# Patient Record
Sex: Male | Born: 2000 | Race: White | Hispanic: No | Marital: Single | Smoking: Never smoker
Health system: Southern US, Community
[De-identification: ages and names within clinical notes are randomized; demographics above are authoritative.]

## PROBLEM LIST (undated history)

## (undated) DIAGNOSIS — F419 Anxiety disorder, unspecified: Secondary | ICD-10-CM

## (undated) DIAGNOSIS — F319 Bipolar disorder, unspecified: Secondary | ICD-10-CM

---
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
## 2019-02-21 ENCOUNTER — Encounter (HOSPITAL_COMMUNITY): Payer: Self-pay | Admitting: Emergency Medicine

## 2019-02-21 ENCOUNTER — Other Ambulatory Visit: Payer: Self-pay

## 2019-02-21 ENCOUNTER — Emergency Department (HOSPITAL_COMMUNITY)
Admission: EM | Admit: 2019-02-21 | Discharge: 2019-02-21 | Disposition: A | Discharge status: Home / Self Care | Payer: Medicaid Other | Attending: Emergency Medicine | Admitting: Emergency Medicine

## 2019-02-21 DIAGNOSIS — Z79899 Other long term (current) drug therapy: Secondary | ICD-10-CM | POA: Insufficient documentation

## 2019-02-21 DIAGNOSIS — R55 Syncope and collapse: Secondary | ICD-10-CM | POA: Insufficient documentation

## 2019-02-21 LAB — CBC WITH DIFFERENTIAL/PLATELET
Abs Immature Granulocytes: 0.02 10*3/uL (ref 0.00–0.07)
Basophils Absolute: 0 10*3/uL (ref 0.0–0.1)
Basophils Relative: 0 %
Eosinophils Absolute: 0.1 10*3/uL (ref 0.0–0.5)
Eosinophils Relative: 1 %
HCT: 45.8 % (ref 39.0–52.0)
Hemoglobin: 15.2 g/dL (ref 13.0–17.0)
Immature Granulocytes: 0 %
Lymphocytes Relative: 17 %
Lymphs Abs: 1.5 10*3/uL (ref 0.7–4.0)
MCH: 32.9 pg (ref 26.0–34.0)
MCHC: 33.2 g/dL (ref 30.0–36.0)
MCV: 99.1 fL (ref 80.0–100.0)
Monocytes Absolute: 0.9 10*3/uL (ref 0.1–1.0)
Monocytes Relative: 11 %
Neutro Abs: 6 10*3/uL (ref 1.7–7.7)
Neutrophils Relative %: 71 %
Platelets: 194 10*3/uL (ref 150–400)
RBC: 4.62 MIL/uL (ref 4.22–5.81)
RDW: 12.6 % (ref 11.5–15.5)
WBC: 8.4 10*3/uL (ref 4.0–10.5)
nRBC: 0 % (ref 0.0–0.2)

## 2019-02-21 LAB — BASIC METABOLIC PANEL
Anion gap: 7 (ref 5–15)
BUN: 12 mg/dL (ref 6–20)
CO2: 25 mmol/L (ref 22–32)
Calcium: 9.2 mg/dL (ref 8.9–10.3)
Chloride: 107 mmol/L (ref 98–111)
Creatinine, Ser: 0.89 mg/dL (ref 0.61–1.24)
GFR calc Af Amer: >60 mL/min (ref >60)
GFR calc non Af Amer: >60 mL/min (ref >60)
Glucose, Bld: 107 mg/dL — ABNORMAL HIGH (ref 70–99)
Potassium: 3.5 mmol/L (ref 3.5–5.1)
Sodium: 139 mmol/L (ref 135–145)

## 2019-02-21 MED ORDER — LACTATED RINGERS IV BOLUS
1000.0000 mL | Freq: Once | INTRAVENOUS | Status: AC
  Administered 2019-02-21: 1000 mL via INTRAVENOUS

## 2019-02-21 NOTE — ED Triage Notes (Signed)
Per [facility] pt comes from laying on the ground at a gas station for heat exhaustion and feeling dizzy. Pt currently staying in hotel not far from gas station and been walking around outside in heat for past 3 days and not eating or drinking. Vitals: 112/68, 80HR, 16R, 100% on RA, CBG 110, 97.5 temp. 18g IV and given NS 531ml in route.

## 2019-02-25 NOTE — ED Provider Notes (Signed)
[facility]-EMERGENCY DEPT Provider Note   CSN: [id number] Arrival date & time: 02/21/19  1618     History   Chief Complaint Chief Complaint  Patient presents with  . Heat Exposure  . Near Syncope    HPI Larry George is a 18 y.o. male.     HPI 18 year old male with near syncope.  Patient stated that he was outside in the heat began to feel dizzy.  He went into a gas station and laid down.  He did not actually lose consciousness.  Patient stated that that he has not been eating or drinking regularly.  He is been spending the last several days out in the heat doing what sounds like panhandling.  He currently feels better.  Denies any acute pain.  Denies any ingestion. Past Medical History:  Diagnosis Date  . Anxiety   . Bipolar 1 disorder [facility](HCC)     Patient Active Problem List   Diagnosis Date Noted  . Paranoid behavior (HCC) 09/25/2018  . Acute psychosis (HCC) 09/25/2018  . Bipolar I disorder, current or most recent episode manic, with psychotic features (HCC)     History reviewed. No pertinent surgical history.      Home Medications    Prior to Admission medications   Medication Sig Start Date End Date Taking? Authorizing Provider  benztropine (COGENTIN) 0.5 MG tablet Take 1 tablet (0.5 mg total) by mouth 2 (two) times daily. 09/30/18   Storm FriskWright, Patrick E, MD  risperiDONE (RISPERDAL) 2 MG tablet Take 1 tablet (2 mg total) by mouth 2 (two) times daily. 09/30/18   Storm FriskWright, Patrick E, MD  traZODone (DESYREL) 150 MG tablet Take 1 tablet (150 mg total) by mouth at bedtime as needed for sleep. 09/30/18   Storm FriskWright, Patrick E, MD    Family History No family history on file.  Social History Social History   Tobacco Use  . Smoking status: Never Smoker  . Smokeless tobacco: Never Used  Substance Use Topics  . Alcohol use: Yes    Frequency: Never  . Drug use: No     Allergies   Patient has no known allergies.   Review of Systems Review of Systems   All systems reviewed and negative, other than as noted in HPI.  Physical Exam Updated Vital Signs BP 123/71 (BP Location: Right Arm)   Pulse 88   Temp 98 F (36.7 C) (Oral)   Resp 15   SpO2 98%   Physical Exam Vitals signs and nursing note reviewed.  Constitutional:      General: He is not in acute distress.    Appearance: He is well-developed.  HENT:     Head: Normocephalic and atraumatic.  Eyes:     General:        Right eye: No discharge.        Left eye: No discharge.     Conjunctiva/sclera: Conjunctivae normal.  Neck:     Musculoskeletal: Neck supple.  Cardiovascular:     Rate and Rhythm: Normal rate and regular rhythm.     Heart sounds: Normal heart sounds. No murmur. No friction rub. No gallop.   Pulmonary:     Effort: Pulmonary effort is normal. No respiratory distress.     Breath sounds: Normal breath sounds.  Abdominal:     General: There is no distension.     Palpations: Abdomen is soft.     Tenderness: There is no abdominal tenderness.  Musculoskeletal:        General:  No tenderness.  Skin:    General: Skin is warm and dry.  Neurological:     Mental Status: He is alert.  Psychiatric:        Behavior: Behavior normal.        Thought Content: Thought content normal.      ED Treatments / Results  Labs (all labs ordered are listed, but only abnormal results are displayed) Labs Reviewed  BASIC METABOLIC PANEL - Abnormal; Notable for the following components:      Result Value   Glucose, Bld 107 (*)    All other components within normal limits  CBC WITH DIFFERENTIAL/PLATELET    EKG EKG Interpretation  Date/Time:  Sunday February 21 2019 17:10:16 EDT Ventricular Rate:  69 PR Interval:    QRS Duration: 85 QT Interval:  392 QTC Calculation: 420 R Axis:   70 Text Interpretation:  Sinus rhythm Short PR interval Confirmed by Dakwon Wenberg ([id number]) on 02/21/2019 5:50:38 PM Also confirmed by Brylie Sneath ([id number]), editor [place], Beverly ([id number])  on  02/22/2019 6:50:38 AM   Radiology No results found.  Procedures Procedures (including critical care time)  Medications Ordered in ED Medications  lactated ringers bolus 1,000 mL (0 mLs Intravenous Stopped 02/21/19 1837)     Initial Impression / Assessment and Plan / ED Course  I have reviewed the triage vital signs and the nursing notes.  Pertinent labs & imaging results that were available during my care of the patient were reviewed by me and considered in my medical decision making (see chart for details).        18  year old male with near syncope in the setting of irregular nutrition and being out in the heat for extended period time over the past several days.  He is currently feeling much better.  He looks well on exam.  He is hemodynamically stable.  Exam is nonfocal.  Basic work-up pretty unremarkable.  I doubt emergent condition.  Advised to stay well-hydrated and cool the best he can.  Emergent return precautions were discussed.  Final Clinical Impressions(s) / ED Diagnoses   Final diagnoses:  Near syncope    ED Discharge Orders    None       Virgel Manifold, MD 02/25/19 [phone]

## 2019-05-01 ENCOUNTER — Other Ambulatory Visit: Payer: Self-pay

## 2019-05-01 ENCOUNTER — Emergency Department (HOSPITAL_COMMUNITY)
Admission: EM | Admit: 2019-05-01 | Discharge: 2019-05-01 | Disposition: A | Discharge status: Home / Self Care | Payer: Medicaid Other | Attending: Emergency Medicine | Admitting: Emergency Medicine

## 2019-05-01 ENCOUNTER — Emergency Department (HOSPITAL_COMMUNITY): Payer: Medicaid Other

## 2019-05-01 DIAGNOSIS — Z79899 Other long term (current) drug therapy: Secondary | ICD-10-CM | POA: Insufficient documentation

## 2019-05-01 DIAGNOSIS — E860 Dehydration: Secondary | ICD-10-CM

## 2019-05-01 DIAGNOSIS — Z20828 Contact with and (suspected) exposure to other viral communicable diseases: Secondary | ICD-10-CM | POA: Insufficient documentation

## 2019-05-01 DIAGNOSIS — R55 Syncope and collapse: Secondary | ICD-10-CM

## 2019-05-01 LAB — BASIC METABOLIC PANEL
Anion gap: 10 (ref 5–15)
BUN: 8 mg/dL (ref 6–20)
CO2: 22 mmol/L (ref 22–32)
Calcium: 9.2 mg/dL (ref 8.9–10.3)
Chloride: 104 mmol/L (ref 98–111)
Creatinine, Ser: 0.94 mg/dL (ref 0.61–1.24)
GFR calc Af Amer: >60 mL/min (ref >60)
GFR calc non Af Amer: >60 mL/min (ref >60)
Glucose, Bld: 89 mg/dL (ref 70–99)
Potassium: 4.2 mmol/L (ref 3.5–5.1)
Sodium: 136 mmol/L (ref 135–145)

## 2019-05-01 LAB — CBC WITH DIFFERENTIAL/PLATELET
Abs Immature Granulocytes: 0.05 10*3/uL (ref 0.00–0.07)
Basophils Absolute: 0 10*3/uL (ref 0.0–0.1)
Basophils Relative: 0 %
Eosinophils Absolute: 0 10*3/uL (ref 0.0–0.5)
Eosinophils Relative: 0 %
HCT: 43.6 % (ref 39.0–52.0)
Hemoglobin: 15.1 g/dL (ref 13.0–17.0)
Immature Granulocytes: 1 %
Lymphocytes Relative: 14 %
Lymphs Abs: 1.5 10*3/uL (ref 0.7–4.0)
MCH: 34 pg (ref 26.0–34.0)
MCHC: 34.6 g/dL (ref 30.0–36.0)
MCV: 98.2 fL (ref 80.0–100.0)
Monocytes Absolute: 1.3 10*3/uL — ABNORMAL HIGH (ref 0.1–1.0)
Monocytes Relative: 12 %
Neutro Abs: 7.7 10*3/uL (ref 1.7–7.7)
Neutrophils Relative %: 73 %
Platelets: 215 10*3/uL (ref 150–400)
RBC: 4.44 MIL/uL (ref 4.22–5.81)
RDW: 12.3 % (ref 11.5–15.5)
WBC: 10.6 10*3/uL — ABNORMAL HIGH (ref 4.0–10.5)
nRBC: 0 % (ref 0.0–0.2)

## 2019-05-01 LAB — CBG MONITORING, ED: Glucose-Capillary: 73 mg/dL (ref 70–99)

## 2019-05-01 MED ORDER — SODIUM CHLORIDE 0.9 % IV BOLUS
1000.0000 mL | Freq: Once | INTRAVENOUS | Status: AC
  Administered 2019-05-01: 16:00:00 1000 mL via INTRAVENOUS

## 2019-05-01 NOTE — Discharge Instructions (Signed)
We will contact you with results of your COVID test if it is positive. Make sure you fluid intake, take your home medications as you are prescribed. Follow-up with your primary care provider. Return to the ED if you start to have worsening symptoms, chest pain, shortness of breath, leg swelling.

## 2019-05-01 NOTE — ED Provider Notes (Signed)
[place] EMERGENCY DEPARTMENT Provider Note   CSN: [id number] Arrival date & time: 05/01/19  1456     History   Chief Complaint Chief Complaint  Patient presents with   Loss of Consciousness    HPI Larry George is a 18 y.o. male with a past medical history of anxiety, bipolar disorder presents to ED for syncope that occurred just prior to arrival.  States that he woke up this morning feeling like his chest was tight, lightheaded.  He was at the bus stop when he told the other people there to call 911 so he could be evaluated in the hospital.  States a known listen to him, he went to a restaurant and tried to tell those people to call 911 when he syncopized there.  States that he has syncopized in the past several times ever since he was younger.  He was never able to find a cause for his syncope.  Patient is homeless and is unsure if he was around sick contacts with similar symptoms.  He reports intermittent dry cough.  Unsure if he has had any fevers.  He was told in the past that he had asthma as a child.  He reports decreased appetite over the past several days, has been drinking more energy drinks because of recommendations by an acquaintance.  He denies any alcohol, drug use or tobacco use, headache, vision changes, vomiting, diarrhea, abdominal pain, numbness in arms or legs.tr     HPI  Past Medical History:  Diagnosis Date   Anxiety    Bipolar 1 disorder [facility])     Patient Active Problem List   Diagnosis Date Noted   Paranoid behavior ([place]) 09/25/2018   Acute psychosis ([place]) 09/25/2018   Bipolar I disorder, current or most recent episode manic, with psychotic features ([place])     No past surgical history on file.      Home Medications    Prior to Admission medications   Medication Sig Start Date End Date Taking? Authorizing Provider  benztropine (COGENTIN) 0.5 MG tablet Take 1 tablet (0.5 mg total) by mouth 2 (two) times daily. 09/30/18   Elsie Stain, MD  risperiDONE (RISPERDAL) 2 MG tablet Take 1 tablet (2 mg total) by mouth 2 (two) times daily. 09/30/18   Elsie Stain, MD  traZODone (DESYREL) 150 MG tablet Take 1 tablet (150 mg total) by mouth at bedtime as needed for sleep. 09/30/18   Elsie Stain, MD    Family History No family history on file.  Social History Social History   Tobacco Use   Smoking status: Never Smoker   Smokeless tobacco: Never Used  Substance Use Topics   Alcohol use: Yes    Frequency: Never   Drug use: No     Allergies   Patient has no known allergies.   Review of Systems Review of Systems  Constitutional: Negative for appetite change, chills and fever.  HENT: Negative for ear pain, rhinorrhea, sneezing and sore throat.   Eyes: Negative for photophobia and visual disturbance.  Respiratory: Positive for cough and chest tightness. Negative for shortness of breath and wheezing.   Cardiovascular: Negative for chest pain and palpitations.  Gastrointestinal: Negative for abdominal pain, blood in stool, constipation, diarrhea, nausea and vomiting.  Genitourinary: Negative for dysuria, hematuria and urgency.  Musculoskeletal: Negative for myalgias.  Skin: Negative for rash.  Neurological: Positive for syncope. Negative for dizziness, weakness and light-headedness.     Physical Exam Updated Vital  Signs BP (!) 104/52    Pulse (!) 59    Temp 98.6 F (37 C) (Oral)    Resp 17    SpO2 96%   Physical Exam Vitals signs and nursing note reviewed.  Constitutional:      General: He is not in acute distress.    Appearance: He is well-developed.  HENT:     Head: Normocephalic and atraumatic.     Nose: Nose normal.  Eyes:     General: No scleral icterus.       Right eye: No discharge.        Left eye: No discharge.     Conjunctiva/sclera: Conjunctivae normal.     Pupils: Pupils are equal, round, and reactive to light.  Neck:     Musculoskeletal: Normal range of motion and neck  supple.  Cardiovascular:     Rate and Rhythm: Normal rate and regular rhythm.     Heart sounds: Normal heart sounds. No murmur. No friction rub. No gallop.   Pulmonary:     Effort: Pulmonary effort is normal. No respiratory distress.     Breath sounds: Normal breath sounds.  Abdominal:     General: Bowel sounds are normal. There is no distension.     Palpations: Abdomen is soft.     Tenderness: There is no abdominal tenderness. There is no guarding.  Musculoskeletal: Normal range of motion.  Skin:    General: Skin is warm and dry.     Findings: No rash.  Neurological:     General: No focal deficit present.     Mental Status: He is alert and oriented to person, place, and time.     Cranial Nerves: No cranial nerve deficit.     Sensory: No sensory deficit.     Motor: No weakness or abnormal muscle tone.     Coordination: Coordination normal.     Comments: Pupils reactive. No facial asymmetry noted. Cranial nerves appear grossly intact. Sensation intact to light touch on face, BUE and BLE. Strength 5/5 in BUE and BLE.  Alert and oriented x4.      ED Treatments / Results  Labs (all labs ordered are listed, but only abnormal results are displayed) Labs Reviewed  CBC WITH DIFFERENTIAL/PLATELET - Abnormal; Notable for the following components:      Result Value   WBC 10.6 (*)    Monocytes Absolute 1.3 (*)    All other components within normal limits  NOVEL CORONAVIRUS, NAA (HOSP ORDER, SEND-OUT TO REF LAB; TAT 18-24 HRS)  BASIC METABOLIC PANEL  CBG MONITORING, ED    EKG EKG Interpretation  Date/Time:  Saturday May 01 2019 15:42:55 EDT Ventricular Rate:  80 PR Interval:    QRS Duration: 80 QT Interval:  389 QTC Calculation: 449 R Axis:   86 Text Interpretation:  Sinus rhythm RSR' in V1 or V2, probably normal variant ST elev, probable normal early repol pattern No STEMI  Confirmed by Alvester Chourifan, Matthew [id number]) on 05/01/2019 3:50:44 PM   Radiology Dg Chest 2  View  Result Date: 05/01/2019 CLINICAL DATA:  Syncopal episode. Myalgias and chest pain with inspiration. EXAM: CHEST - 2 VIEW COMPARISON:  09/26/2018 FINDINGS: The heart size and mediastinal contours are within normal limits. Both lungs are clear. The visualized skeletal structures are unremarkable. IMPRESSION: Normal examination. Electronically Signed   By: Beckie SaltsSteven  Reid M.D.   On: 05/01/2019 16:56    Procedures Procedures (including critical care time)  Medications Ordered in ED Medications  sodium chloride  0.9 % bolus 1,000 mL (1,000 mLs Intravenous New Bag/Given 05/01/19 1551)     Initial Impression / Assessment and Plan / ED Course  I have reviewed the triage vital signs and the nursing notes.  Pertinent labs & imaging results that were available during my care of the patient were reviewed by me and considered in my medical decision making (see chart for details).  Clinical Course as of May 01 1851  Sat May 01, 2019  1850 Orthostatic Lying  BP- Lying:105/72 (pt states having slight dizziness in all three positions) Pulse- Lying:65 Orthostatic Sitting BP- Sitting:139/88 Pulse- Sitting:63 Orthostatic Standing at 0 minutes BP- Standing at 0 minutes:118/82 Pulse- Standing at 0 minutes:88   [HK]    Clinical Course User Index [HK] Dietrich Pates, PA-C       18 year old male with past medical history of bipolar disorder, presents to ED for syncopal episode that occurred prior to arrival.  States that he woke up this morning feeling like his chest was tight and was coughing.  He does note having frequent energy drinks over the past few days ago but did not eat that much yesterday.  He has been having syncopal episodes since he was younger without specific cause.  On exam patient is overall well-appearing.  He is not tachycardic, tachypneic or hypoxic.  No deficits neurological exam noted.  No tenderness palpation of the cervical spine.  He is ambulatory with normal gait.   Orthostatic vital signs as noted above.  EKG with normal sinus rhythm.  CBC, BMP and CBC unremarkable.  Chest x-ray is unremarkable.  Patient was given IV fluids with significant improvement in his symptoms.  He is PERC negative.  He is low risk by heart score so I do not feel that his symptoms are due to ACS.  He is homeless, unsure if he has had any sick contacts with COVID-19 so we will send an outpatient COVID test.  We will have him follow-up with PCP and return for worsening symptoms.  Patient is hemodynamically stable, in NAD, and able to ambulate in the ED. Evaluation does not show pathology that would require ongoing emergent intervention or inpatient treatment. I explained the diagnosis to the patient. Pain has been managed and has no complaints prior to discharge. Patient is comfortable with above plan and is stable for discharge at this time. All questions were answered prior to disposition. Strict return precautions for returning to the ED were discussed. Encouraged follow up with PCP.   An After Visit Summary was printed and given to the patient.   Portions of this note were generated with Scientist, clinical (histocompatibility and immunogenetics). Dictation errors may occur despite best attempts at proofreading.   Final Clinical Impressions(s) / ED Diagnoses   Final diagnoses:  Syncope and collapse  Dehydration    ED Discharge Orders    None       Dietrich Pates, PA-C 05/01/19 1852    Terald Sleeper, MD 05/01/19 2351

## 2019-05-01 NOTE — ED Triage Notes (Signed)
[facility] reports that the patient was at the bus stop when he became light-headed and weak. He stumbled into a [place] and had a syncopal episode. He has been complaining of body aches and chest pain when he breathes in. He denies N/V/D and cough. EMS gave 324 mg of aspirin.  Last vitals: 102/56, rr-28, hr-84, 100% RA, CBG-102 and 100.2 F.

## 2019-05-03 LAB — NOVEL CORONAVIRUS, NAA (HOSP ORDER, SEND-OUT TO REF LAB; TAT 18-24 HRS): SARS-CoV-2, NAA: NOT DETECTED

## 2019-05-04 ENCOUNTER — Other Ambulatory Visit: Payer: Self-pay

## 2019-05-04 ENCOUNTER — Emergency Department (HOSPITAL_COMMUNITY)
Admission: EM | Admit: 2019-05-04 | Discharge: 2019-05-04 | Disposition: A | Discharge status: Home / Self Care | Payer: Medicaid Other | Attending: Emergency Medicine | Admitting: Emergency Medicine

## 2019-05-04 ENCOUNTER — Encounter (HOSPITAL_COMMUNITY): Payer: Self-pay | Admitting: Emergency Medicine

## 2019-05-04 DIAGNOSIS — Z79899 Other long term (current) drug therapy: Secondary | ICD-10-CM | POA: Insufficient documentation

## 2019-05-04 DIAGNOSIS — R55 Syncope and collapse: Secondary | ICD-10-CM | POA: Diagnosis not present

## 2019-05-04 LAB — CBG MONITORING, ED: Glucose-Capillary: 131 mg/dL — ABNORMAL HIGH (ref 70–99)

## 2019-05-04 NOTE — ED Triage Notes (Signed)
Pt presents to ED BIB [facility]. Pt found unresponsive on side of road. Per witness pt was seen doing "jerking-like" movements. Pt no memory of this. Pt says he just "blacks out a lot". EMS VSS.

## 2019-05-04 NOTE — Discharge Instructions (Signed)
Follow up with your family do

## 2019-05-04 NOTE — ED Provider Notes (Signed)
[facility] EMERGENCY DEPARTMENT Provider Note   CSN: [id number] Arrival date & time: 05/04/19  2219     History   Chief Complaint Chief Complaint  Patient presents with  . Loss of Consciousness    HPI Larry George is a 18 y.o. male.     18 yo M with a chief complaints of a syncopal event.  Patient is somewhat vague on the exact events.  There was some described shaking by a bystander.  Patient states that he has not been taking his medications for over a year.  Denies starting them back after being hospitalized in March.  Denies any other recent illnesses.  He states that these episodes happen to him a lot where he spontaneously collapses.  No one seems to know what they are.  The history is provided by the patient.  Loss of Consciousness Associated symptoms: no chest pain, no confusion, no fever, no headaches, no palpitations, no shortness of breath and no vomiting   Illness Severity:  Moderate Onset quality:  Gradual Duration:  2 minutes Timing:  Intermittent Progression:  Resolved Chronicity:  Recurrent Associated symptoms: no abdominal pain, no chest pain, no congestion, no diarrhea, no fever, no headaches, no myalgias, no rash, no shortness of breath and no vomiting     Past Medical History:  Diagnosis Date  . Anxiety   . Bipolar 1 disorder [facility])     Patient Active Problem List   Diagnosis Date Noted  . Paranoid behavior (HCC) 09/25/2018  . Acute psychosis (HCC) 09/25/2018  . Bipolar I disorder, current or most recent episode manic, with psychotic features (HCC)     History reviewed. No pertinent surgical history.      Home Medications    Prior to Admission medications   Medication Sig Start Date End Date Taking? Authorizing Provider  amphetamine-dextroamphetamine (ADDERALL XR) 10 MG 24 hr capsule Take 10 mg by mouth daily. 08/04/15  Yes [provider]  benztropine (COGENTIN) 0.5 MG tablet Take 1 tablet (0.5 mg total) by mouth  2 (two) times daily. 09/30/18  Yes Storm Frisk, MD  risperiDONE (RISPERDAL) 2 MG tablet Take 1 tablet (2 mg total) by mouth 2 (two) times daily. 09/30/18  Yes Storm Frisk, MD  traZODone (DESYREL) 150 MG tablet Take 1 tablet (150 mg total) by mouth at bedtime as needed for sleep. 09/30/18  Yes Storm Frisk, MD    Family History History reviewed. No pertinent family history.  Social History Social History   Tobacco Use  . Smoking status: Never Smoker  . Smokeless tobacco: Never Used  Substance Use Topics  . Alcohol use: Yes    Frequency: Never  . Drug use: No     Allergies   Patient has no known allergies.   Review of Systems Review of Systems  Constitutional: Negative for chills and fever.  HENT: Negative for congestion and facial swelling.   Eyes: Negative for discharge and visual disturbance.  Respiratory: Negative for shortness of breath.   Cardiovascular: Positive for syncope. Negative for chest pain and palpitations.  Gastrointestinal: Negative for abdominal pain, diarrhea and vomiting.  Musculoskeletal: Negative for arthralgias and myalgias.  Skin: Negative for color change and rash.  Neurological: Positive for syncope. Negative for tremors and headaches.  Psychiatric/Behavioral: Negative for confusion and dysphoric mood.     Physical Exam Updated Vital Signs Pulse (!) 59   Resp 17   Ht 5\' 9"  (1.753 m)   Wt 54 kg  SpO2 98%   BMI 17.57 kg/m   Physical Exam Vitals signs and nursing note reviewed.  Constitutional:      Appearance: He is well-developed.  HENT:     Head: Normocephalic and atraumatic.  Eyes:     Pupils: Pupils are equal, round, and reactive to light.  Neck:     Musculoskeletal: Normal range of motion and neck supple.     Vascular: No JVD.  Cardiovascular:     Rate and Rhythm: Normal rate and regular rhythm.     Heart sounds: No murmur. No friction rub. No gallop.   Pulmonary:     Effort: No respiratory distress.     Breath  sounds: No wheezing.  Abdominal:     General: There is no distension.     Tenderness: There is no abdominal tenderness. There is no guarding or rebound.  Musculoskeletal: Normal range of motion.  Skin:    Coloration: Skin is not pale.     Findings: No rash.  Neurological:     Mental Status: He is alert and oriented to person, place, and time.  Psychiatric:        Behavior: Behavior normal.      ED Treatments / Results  Labs (all labs ordered are listed, but only abnormal results are displayed) Labs Reviewed  CBG MONITORING, ED - Abnormal; Notable for the following components:      Result Value   Glucose-Capillary 131 (*)    All other components within normal limits    EKG EKG Interpretation  Date/Time:  Tuesday May 04 2019 22:30:00 EDT Ventricular Rate:  66 PR Interval:    QRS Duration: 79 QT Interval:  404 QTC Calculation: 424 R Axis:   65 Text Interpretation:  Sinus rhythm RSR' in V1 or V2, probably normal variant no wpw, prolonged qt or brugada No significant change since last tracing Confirmed by Deno Etienne [phone]) on 05/04/2019 10:32:32 PM   Radiology No results found.  Procedures Procedures (including critical care time)  Medications Ordered in ED Medications - No data to display   Initial Impression / Assessment and Plan / ED Course  I have reviewed the triage vital signs and the nursing notes.  Pertinent labs & imaging results that were available during my care of the patient were reviewed by me and considered in my medical decision making (see chart for details).        18 yo M with a chief complaint of a syncopal event.  Patient is well-appearing and nontoxic.  He has had multiple of these in the past.  Unsure of the exact etiology though he has had work-up in the ED as recent as 2 days ago.  Had lab work performed.  Young person not on medications I do not feel that I need to repeat labs today.  EKG is essentially unchanged.  Will discharge  home.  11:20 PM:  I have discussed the diagnosis/risks/treatment options with the patient and believe the pt to be eligible for discharge home to follow-up with PCP. We also discussed returning to the ED immediately if new or worsening sx occur. We discussed the sx which are most concerning (e.g., sudden worsening pain, fever, inability to tolerate by mouth) that necessitate immediate return. Medications administered to the patient during their visit and any new prescriptions provided to the patient are listed below.  Medications given during this visit Medications - No data to display   The patient appears reasonably screen and/or stabilized for discharge and I  doubt any other medical condition or other [facility] requiring further screening, evaluation, or treatment in the ED at this time prior to discharge.    Final Clinical Impressions(s) / ED Diagnoses   Final diagnoses:  Syncope and collapse    ED Discharge Orders    None       Melene PlanFloyd, Mckennon Zwart, DO 05/04/19 2320

## 2019-05-04 NOTE — ED Notes (Signed)
Discharge instructions discussed with pt. Pt verbalized understanding. Pt stable and ambulatory. No signature pad available. 

## 2019-05-05 ENCOUNTER — Encounter (HOSPITAL_COMMUNITY): Payer: Self-pay | Admitting: *Deleted

## 2019-05-05 ENCOUNTER — Emergency Department (HOSPITAL_COMMUNITY)
Admission: EM | Admit: 2019-05-05 | Discharge: 2019-05-05 | Disposition: A | Discharge status: Home / Self Care | Payer: Medicaid Other | Attending: Emergency Medicine | Admitting: Emergency Medicine

## 2019-05-05 DIAGNOSIS — R55 Syncope and collapse: Secondary | ICD-10-CM | POA: Diagnosis not present

## 2019-05-05 LAB — BASIC METABOLIC PANEL
Anion gap: 11 (ref 5–15)
BUN: 9 mg/dL (ref 6–20)
CO2: 26 mmol/L (ref 22–32)
Calcium: 9.4 mg/dL (ref 8.9–10.3)
Chloride: 101 mmol/L (ref 98–111)
Creatinine, Ser: 0.84 mg/dL (ref 0.61–1.24)
GFR calc Af Amer: >60 mL/min (ref >60)
GFR calc non Af Amer: >60 mL/min (ref >60)
Glucose, Bld: 102 mg/dL — ABNORMAL HIGH (ref 70–99)
Potassium: 3.6 mmol/L (ref 3.5–5.1)
Sodium: 138 mmol/L (ref 135–145)

## 2019-05-05 LAB — URINALYSIS, ROUTINE W REFLEX MICROSCOPIC
Bilirubin Urine: NEGATIVE
Glucose, UA: NEGATIVE mg/dL
Hgb urine dipstick: NEGATIVE
Ketones, ur: NEGATIVE mg/dL
Leukocytes,Ua: NEGATIVE
Nitrite: NEGATIVE
Protein, ur: NEGATIVE mg/dL
Specific Gravity, Urine: 1.008 (ref 1.005–1.030)
pH: 7 (ref 5.0–8.0)

## 2019-05-05 LAB — CBC
HCT: 46.3 % (ref 39.0–52.0)
Hemoglobin: 15 g/dL (ref 13.0–17.0)
MCH: 32.9 pg (ref 26.0–34.0)
MCHC: 32.4 g/dL (ref 30.0–36.0)
MCV: 101.5 fL — ABNORMAL HIGH (ref 80.0–100.0)
Platelets: 241 10*3/uL (ref 150–400)
RBC: 4.56 MIL/uL (ref 4.22–5.81)
RDW: 12.5 % (ref 11.5–15.5)
WBC: 9.1 10*3/uL (ref 4.0–10.5)
nRBC: 0 % (ref 0.0–0.2)

## 2019-05-05 LAB — CBG MONITORING, ED: Glucose-Capillary: 89 mg/dL (ref 70–99)

## 2019-05-05 MED ORDER — SODIUM CHLORIDE 0.9% FLUSH: 3.0000 mL | Freq: Once | INTRAVENOUS | Status: DC

## 2019-05-05 NOTE — ED Triage Notes (Signed)
Pt bib EMS and presents after a witnessed LOC.  Pt was walking through the parking lot and sat down and was out for brief period of time. On EMS arrival, pt was alert.  In triage, pt is a/o x 4 and denies any pain. Pt reports some dizziness.  EMS 12 lead was unremarkable. Pt was evaluated for similar yesterday at [facility] and was dx with dehydration.  Pt is homeless and has issues obtaining medications.  EMS VS: BP: 100/60, HR: 60-80, RR 40-20, O2: 100% Temp 99.7

## 2019-05-05 NOTE — ED Provider Notes (Signed)
[facility]-EMERGENCY DEPT Provider Note   CSN: [id number] Arrival date & time: 05/05/19  1517     History   Chief Complaint Chief Complaint  Patient presents with  . Loss of Consciousness    HPI Larry George is a 18 y.o. male.     18 yo M with a cc of syncope.  Hx of same.  4th visit in a week.    Gets headaches and collapses.  Discussed with mom on the phone, mom and dad have hx of the same.  No other new issues for him, no fevers.    The history is provided by the patient.  Loss of Consciousness Episode history:  Single Most recent episode:  Today Duration:  2 minutes Timing:  Constant Progression:  Resolved Chronicity:  New Witnessed: no   Relieved by:  Nothing Worsened by:  Nothing Ineffective treatments:  None tried Associated symptoms: no chest pain, no confusion, no fever, no headaches, no palpitations, no shortness of breath and no vomiting     Past Medical History:  Diagnosis Date  . Anxiety   . Bipolar 1 disorder [facility](HCC)     Patient Active Problem List   Diagnosis Date Noted  . Paranoid behavior (HCC) 09/25/2018  . Acute psychosis (HCC) 09/25/2018  . Bipolar I disorder, current or most recent episode manic, with psychotic features (HCC)     History reviewed. No pertinent surgical history.      Home Medications    Prior to Admission medications   Medication Sig Start Date End Date Taking? Authorizing Provider  benztropine (COGENTIN) 0.5 MG tablet Take 1 tablet (0.5 mg total) by mouth 2 (two) times daily. Patient not taking: Reported on 05/05/2019 09/30/18   Storm FriskWright, Patrick E, MD  risperiDONE (RISPERDAL) 2 MG tablet Take 1 tablet (2 mg total) by mouth 2 (two) times daily. Patient not taking: Reported on 05/05/2019 09/30/18   Storm FriskWright, Patrick E, MD  traZODone (DESYREL) 150 MG tablet Take 1 tablet (150 mg total) by mouth at bedtime as needed for sleep. Patient not taking: Reported on 05/05/2019 09/30/18   Storm FriskWright, Patrick E, MD     Family History No family history on file.  Social History Social History   Tobacco Use  . Smoking status: Never Smoker  . Smokeless tobacco: Never Used  Substance Use Topics  . Alcohol use: Yes    Frequency: Never  . Drug use: No     Allergies   Patient has no known allergies.   Review of Systems Review of Systems  Constitutional: Negative for chills and fever.  HENT: Negative for congestion and facial swelling.   Eyes: Negative for discharge and visual disturbance.  Respiratory: Negative for shortness of breath.   Cardiovascular: Positive for syncope. Negative for chest pain and palpitations.  Gastrointestinal: Negative for abdominal pain, diarrhea and vomiting.  Musculoskeletal: Negative for arthralgias and myalgias.  Skin: Negative for color change and rash.  Neurological: Negative for tremors, syncope and headaches.  Psychiatric/Behavioral: Negative for confusion and dysphoric mood.     Physical Exam Updated Vital Signs BP 125/77 (BP Location: Right Arm)   Pulse 63   Temp 98.3 F (36.8 C) (Oral)   Resp 13   Ht 5\' 9"  (1.753 m)   Wt 54.4 kg   SpO2 97%   BMI 17.72 kg/m   Physical Exam Vitals signs and nursing note reviewed.  Constitutional:      Appearance: He is well-developed.  HENT:     Head: Normocephalic  and atraumatic.  Eyes:     Pupils: Pupils are equal, round, and reactive to light.  Neck:     Musculoskeletal: Normal range of motion and neck supple.     Vascular: No JVD.  Cardiovascular:     Rate and Rhythm: Normal rate and regular rhythm.     Heart sounds: No murmur. No friction rub. No gallop.   Pulmonary:     Effort: No respiratory distress.     Breath sounds: No wheezing.  Abdominal:     General: There is no distension.     Tenderness: There is no guarding or rebound.  Musculoskeletal: Normal range of motion.  Skin:    Coloration: Skin is not pale.     Findings: No rash.  Neurological:     Mental Status: He is alert and oriented  to person, place, and time.  Psychiatric:        Behavior: Behavior normal.      ED Treatments / Results  Labs (all labs ordered are listed, but only abnormal results are displayed) Labs Reviewed  BASIC METABOLIC PANEL - Abnormal; Notable for the following components:      Result Value   Glucose, Bld 102 (*)    All other components within normal limits  CBC - Abnormal; Notable for the following components:   MCV 101.5 (*)    All other components within normal limits  URINALYSIS, ROUTINE W REFLEX MICROSCOPIC - Abnormal; Notable for the following components:   Color, Urine STRAW (*)    All other components within normal limits  CBG MONITORING, ED    EKG EKG Interpretation  Date/Time:  Wednesday May 05 2019 15:34:10 EDT Ventricular Rate:  75 PR Interval:    QRS Duration: 79 QT Interval:  382 QTC Calculation: 427 R Axis:   65 Text Interpretation:  Sinus rhythm Borderline short PR interval ST elev, probable normal early repol pattern since last tracing no significant change Confirmed by Daleen Bo [phone]) on 05/05/2019 6:28:48 PM   Radiology No results found.  Procedures Procedures (including critical care time)  Medications Ordered in ED Medications  sodium chloride flush (NS) 0.9 % injection 3 mL (3 mLs Intravenous Not Given 05/05/19 2052)     Initial Impression / Assessment and Plan / ED Course  I have reviewed the triage vital signs and the nursing notes.  Pertinent labs & imaging results that were available during my care of the patient were reviewed by me and considered in my medical decision making (see chart for details).        18 yo M with a cc of syncope.  Multiple visits here to the ED recently.  Talked with mom on the phone who felt that patient should follow up with his PCP in [place].  Patient wants to stay here, will get case management to try and set up appointment with pcp/neuro.    11:07 PM:  I have discussed the diagnosis/risks/treatment  options with the patient and believe the pt to be eligible for discharge home to follow-up with PCP, neuro. We also discussed returning to the ED immediately if new or worsening sx occur. We discussed the sx which are most concerning (e.g., sudden worsening pain, fever, inability to tolerate by mouth) that necessitate immediate return. Medications administered to the patient during their visit and any new prescriptions provided to the patient are listed below.  Medications given during this visit Medications  sodium chloride flush (NS) 0.9 % injection 3 mL (3 mLs Intravenous Not  Given 05/05/19 2052)     The patient appears reasonably screen and/or stabilized for discharge and I doubt any other medical condition or other [facility] requiring further screening, evaluation, or treatment in the ED at this time prior to discharge.    Final Clinical Impressions(s) / ED Diagnoses   Final diagnoses:  Syncope and collapse    ED Discharge Orders         Ordered    Ambulatory referral to Neurology    Comments: Seizures?   05/05/19 2051           Melene Plan, DO 05/05/19 2307

## 2019-05-07 ENCOUNTER — Telehealth: Payer: Self-pay | Admitting: *Deleted

## 2019-05-07 NOTE — Telephone Encounter (Addendum)
TOC CM attempted to call pt and no number listed for patient. Contacted mother, emergency contact. Provided her with my information and she will send to pt. Pt will need a follow up with PCP. Per stepmother, pt is homeless. He does not have phone. They have worked with him trying to get assistance but pt chooses to live on the street. Pt had SSI in the past, but currently does not have any finances. Medicaid is listed as insurance. [place], [place] ED TOC CM [phone]

## 2019-05-20 ENCOUNTER — Encounter (HOSPITAL_COMMUNITY): Payer: Self-pay | Admitting: Emergency Medicine

## 2019-05-20 ENCOUNTER — Emergency Department (HOSPITAL_COMMUNITY)
Admission: EM | Admit: 2019-05-20 | Discharge: 2019-05-20 | Disposition: A | Discharge status: Home / Self Care | Payer: Medicaid Other | Attending: Emergency Medicine | Admitting: Emergency Medicine

## 2019-05-20 ENCOUNTER — Other Ambulatory Visit: Payer: Self-pay

## 2019-05-20 DIAGNOSIS — Z5321 Procedure and treatment not carried out due to patient leaving prior to being seen by health care provider: Secondary | ICD-10-CM | POA: Diagnosis not present

## 2019-05-20 DIAGNOSIS — R55 Syncope and collapse: Secondary | ICD-10-CM | POA: Diagnosis not present

## 2019-05-20 LAB — CBC
HCT: 44.8 % (ref 39.0–52.0)
Hemoglobin: 14.3 g/dL (ref 13.0–17.0)
MCH: 32.9 pg (ref 26.0–34.0)
MCHC: 31.9 g/dL (ref 30.0–36.0)
MCV: 103 fL — ABNORMAL HIGH (ref 80.0–100.0)
Platelets: 198 10*3/uL (ref 150–400)
RBC: 4.35 MIL/uL (ref 4.22–5.81)
RDW: 12.9 % (ref 11.5–15.5)
WBC: 8.6 10*3/uL (ref 4.0–10.5)
nRBC: 0 % (ref 0.0–0.2)

## 2019-05-20 LAB — BASIC METABOLIC PANEL
Anion gap: 6 (ref 5–15)
BUN: 12 mg/dL (ref 6–20)
CO2: 25 mmol/L (ref 22–32)
Calcium: 9 mg/dL (ref 8.9–10.3)
Chloride: 109 mmol/L (ref 98–111)
Creatinine, Ser: 0.84 mg/dL (ref 0.61–1.24)
GFR calc Af Amer: >60 mL/min (ref >60)
GFR calc non Af Amer: >60 mL/min (ref >60)
Glucose, Bld: 97 mg/dL (ref 70–99)
Potassium: 4.1 mmol/L (ref 3.5–5.1)
Sodium: 140 mmol/L (ref 135–145)

## 2019-05-20 NOTE — ED Notes (Signed)
Patient has a extra tube of blood in the main lab.  One gold top

## 2019-05-20 NOTE — ED Triage Notes (Signed)
Per [facility] [place] where [place] episode. Pt been seen here couple times for the same. Pt became agitated when EMS was asking him questions.  Vitals: 108/70, R14, 97% on RA, temp 98.4. CBG 136, NSR on EKG.

## 2019-05-28 ENCOUNTER — Other Ambulatory Visit: Payer: Self-pay

## 2019-05-28 DIAGNOSIS — Z20822 Contact with and (suspected) exposure to covid-19: Secondary | ICD-10-CM

## 2019-05-30 LAB — NOVEL CORONAVIRUS, NAA: SARS-CoV-2, NAA: NOT DETECTED

## 2019-06-19 ENCOUNTER — Encounter (HOSPITAL_COMMUNITY): Payer: Self-pay

## 2019-06-19 ENCOUNTER — Emergency Department (HOSPITAL_COMMUNITY)
Admission: EM | Admit: 2019-06-19 | Discharge: 2019-06-19 | Disposition: A | Discharge status: Home / Self Care | Payer: Medicaid Other | Attending: Emergency Medicine | Admitting: Emergency Medicine

## 2019-06-19 DIAGNOSIS — Z5321 Procedure and treatment not carried out due to patient leaving prior to being seen by health care provider: Secondary | ICD-10-CM | POA: Insufficient documentation

## 2019-06-19 DIAGNOSIS — R55 Syncope and collapse: Secondary | ICD-10-CM | POA: Diagnosis present

## 2019-06-19 LAB — BASIC METABOLIC PANEL
Anion gap: 8 (ref 5–15)
BUN: 9 mg/dL (ref 6–20)
CO2: 25 mmol/L (ref 22–32)
Calcium: 9.6 mg/dL (ref 8.9–10.3)
Chloride: 105 mmol/L (ref 98–111)
Creatinine, Ser: 0.83 mg/dL (ref 0.61–1.24)
GFR calc Af Amer: >60 mL/min (ref >60)
GFR calc non Af Amer: >60 mL/min (ref >60)
Glucose, Bld: 99 mg/dL (ref 70–99)
Potassium: 4.3 mmol/L (ref 3.5–5.1)
Sodium: 138 mmol/L (ref 135–145)

## 2019-06-19 LAB — CBC
HCT: 49 % (ref 39.0–52.0)
Hemoglobin: 16.1 g/dL (ref 13.0–17.0)
MCH: 32.8 pg (ref 26.0–34.0)
MCHC: 32.9 g/dL (ref 30.0–36.0)
MCV: 99.8 fL (ref 80.0–100.0)
Platelets: 250 10*3/uL (ref 150–400)
RBC: 4.91 MIL/uL (ref 4.22–5.81)
RDW: 12.6 % (ref 11.5–15.5)
WBC: 9.4 10*3/uL (ref 4.0–10.5)
nRBC: 0 % (ref 0.0–0.2)

## 2019-06-19 MED ORDER — SODIUM CHLORIDE 0.9% FLUSH: 3.0000 mL | Freq: Once | INTRAVENOUS | Status: DC

## 2019-06-19 NOTE — ED Triage Notes (Signed)
Pt presents with c/o syncopal episode x 2 reported by patient. Pt reports he closed his eyes for what he thought was a second and then woke up on the ground. Pt c/o dizziness. Also c/o back pain x 4 days from a previous fall.

## 2020-02-20 IMAGING — CR DG SHOULDER 2+V*R*
3 series · 3 of 3 positions shown · non-contrast
Comparison: None.

CLINICAL DATA: Right shoulder pain after fall on the tenth.

EXAM:
RIGHT SHOULDER - 2+ VIEW

[w shoulder external right]
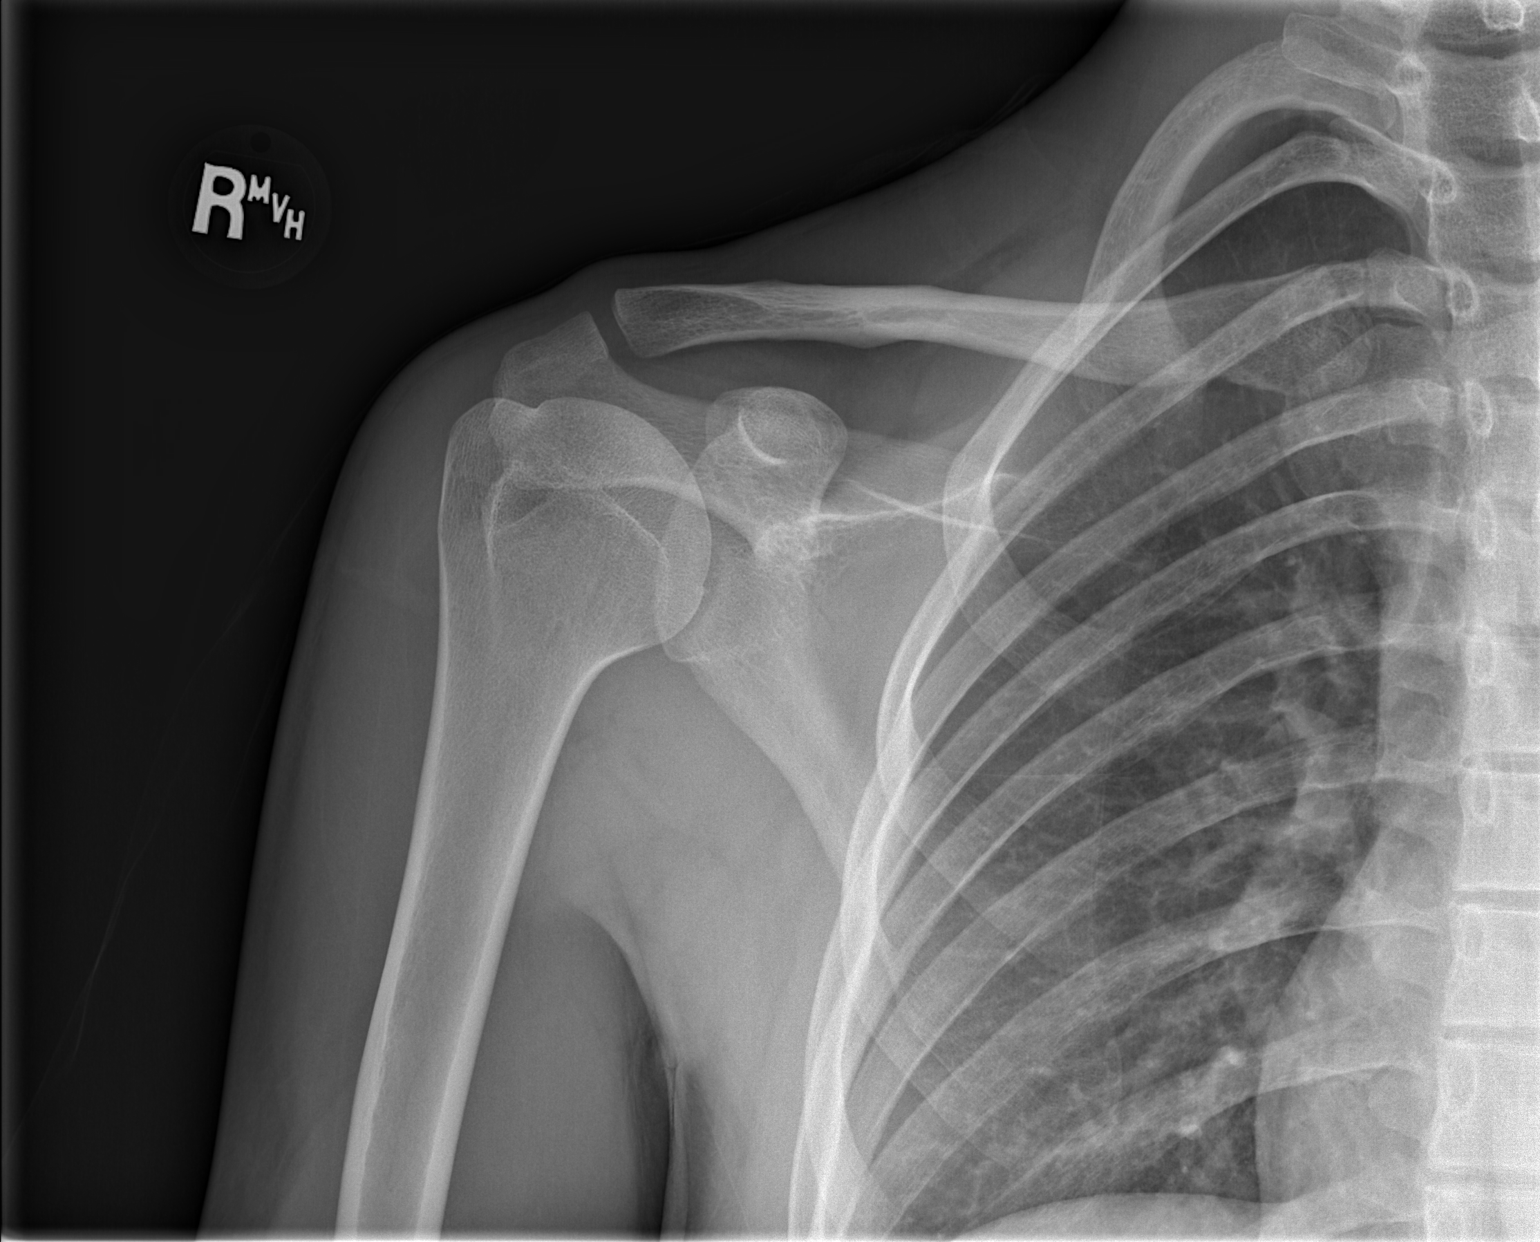

[w shoulder y-view right]
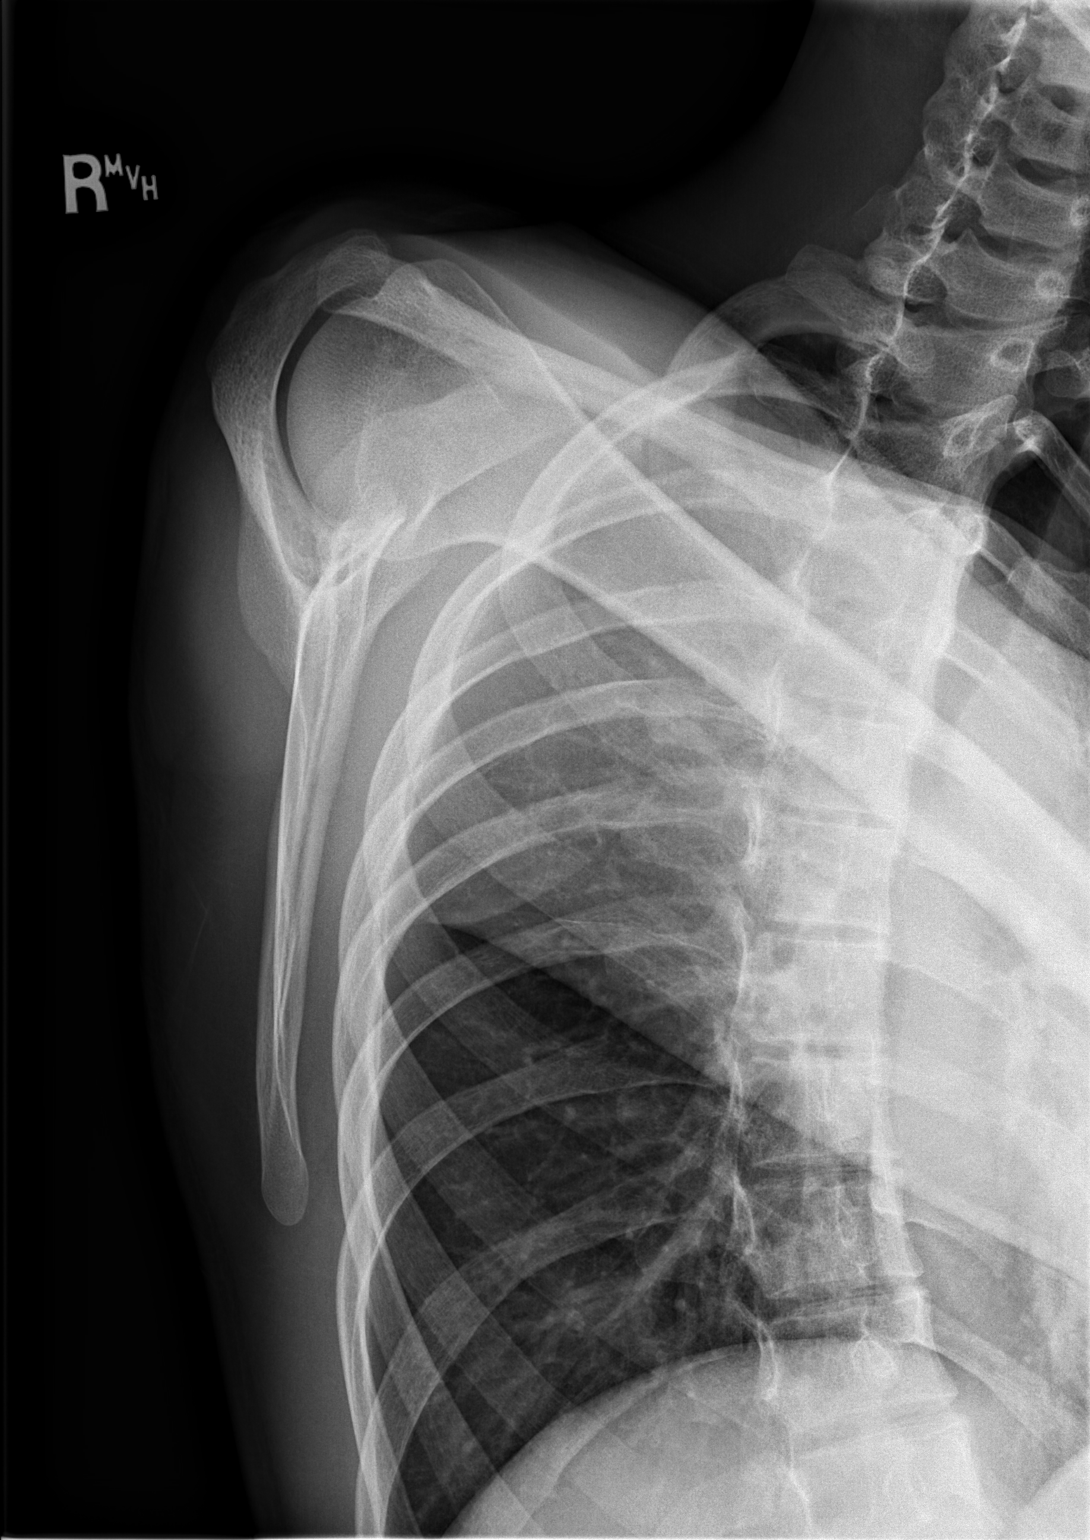

[x shoulder axillary right]
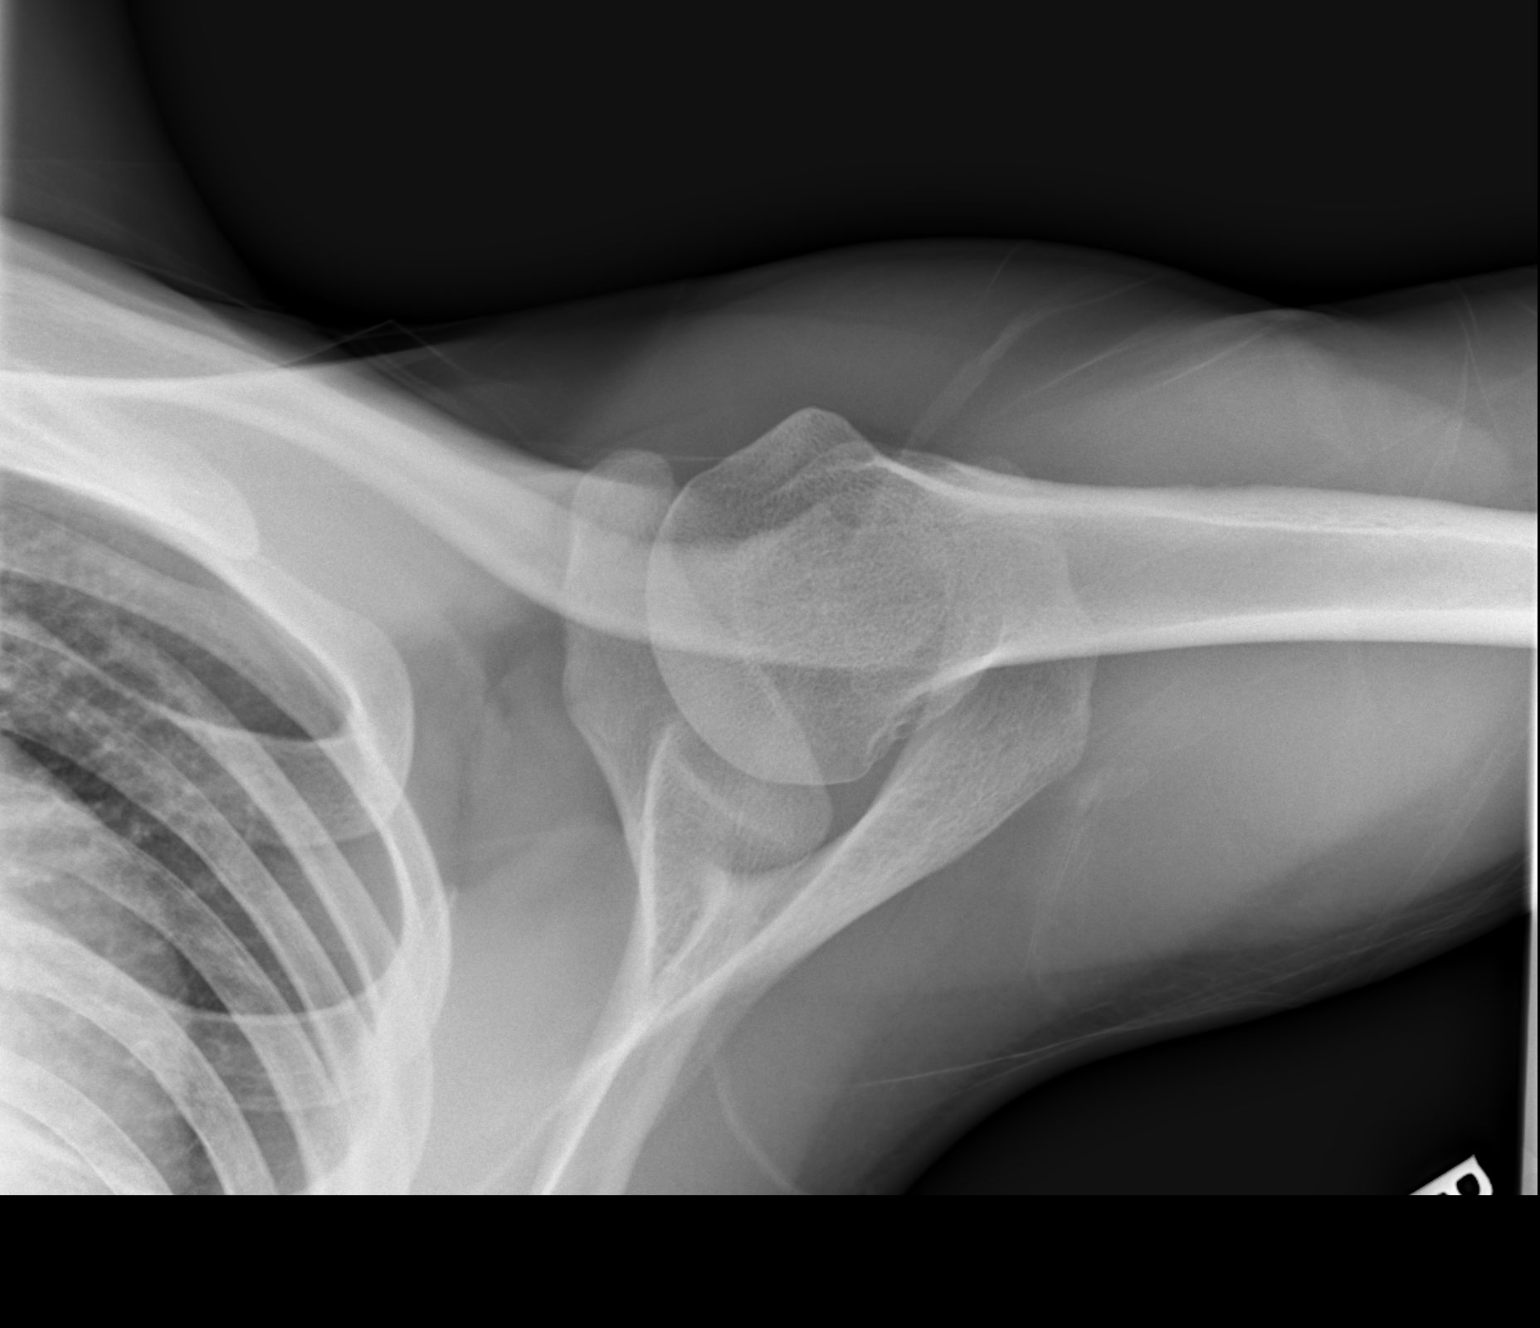

[3 of 3 positions shown; findings below may reference images not displayed]

FINDINGS: There is no evidence of fracture or dislocation. There is no
evidence of arthropathy or other focal bone abnormality. Soft
tissues are unremarkable.
IMPRESSION: Negative.

## 2020-03-10 ENCOUNTER — Other Ambulatory Visit: Payer: Self-pay

## 2020-03-11 ENCOUNTER — Encounter (HOSPITAL_COMMUNITY): Payer: Self-pay | Admitting: Emergency Medicine

## 2020-03-11 ENCOUNTER — Emergency Department (HOSPITAL_COMMUNITY)
Admission: EM | Admit: 2020-03-11 | Discharge: 2020-03-11 | Discharge status: Against Medical Advice | Payer: Medicaid Other

## 2020-03-11 ENCOUNTER — Emergency Department (HOSPITAL_COMMUNITY)
Admission: EM | Admit: 2020-03-11 | Discharge: 2020-03-11 | Disposition: A | Discharge status: Home / Self Care | Payer: MEDICAID | Attending: Emergency Medicine | Admitting: Emergency Medicine

## 2020-03-11 DIAGNOSIS — F419 Anxiety disorder, unspecified: Secondary | ICD-10-CM | POA: Diagnosis not present

## 2020-03-11 DIAGNOSIS — Z5321 Procedure and treatment not carried out due to patient leaving prior to being seen by health care provider: Secondary | ICD-10-CM | POA: Insufficient documentation

## 2020-03-11 NOTE — ED Triage Notes (Signed)
Patient here from home reporting anxiety. States that he was just released from jail and no one will take him home to [place], [place].

## 2020-03-11 NOTE — ED Triage Notes (Signed)
Patient released from jail today and [place] and now has right flank pain. No other complaints.

## 2020-10-10 IMAGING — DX DG CHEST 2V
2 series · 2 of 2 positions shown · non-contrast
Comparison: 09/26/2018

CLINICAL DATA: Syncopal episode. Myalgias and chest pain with
inspiration.

EXAM:
CHEST - 2 VIEW

[chest lat]
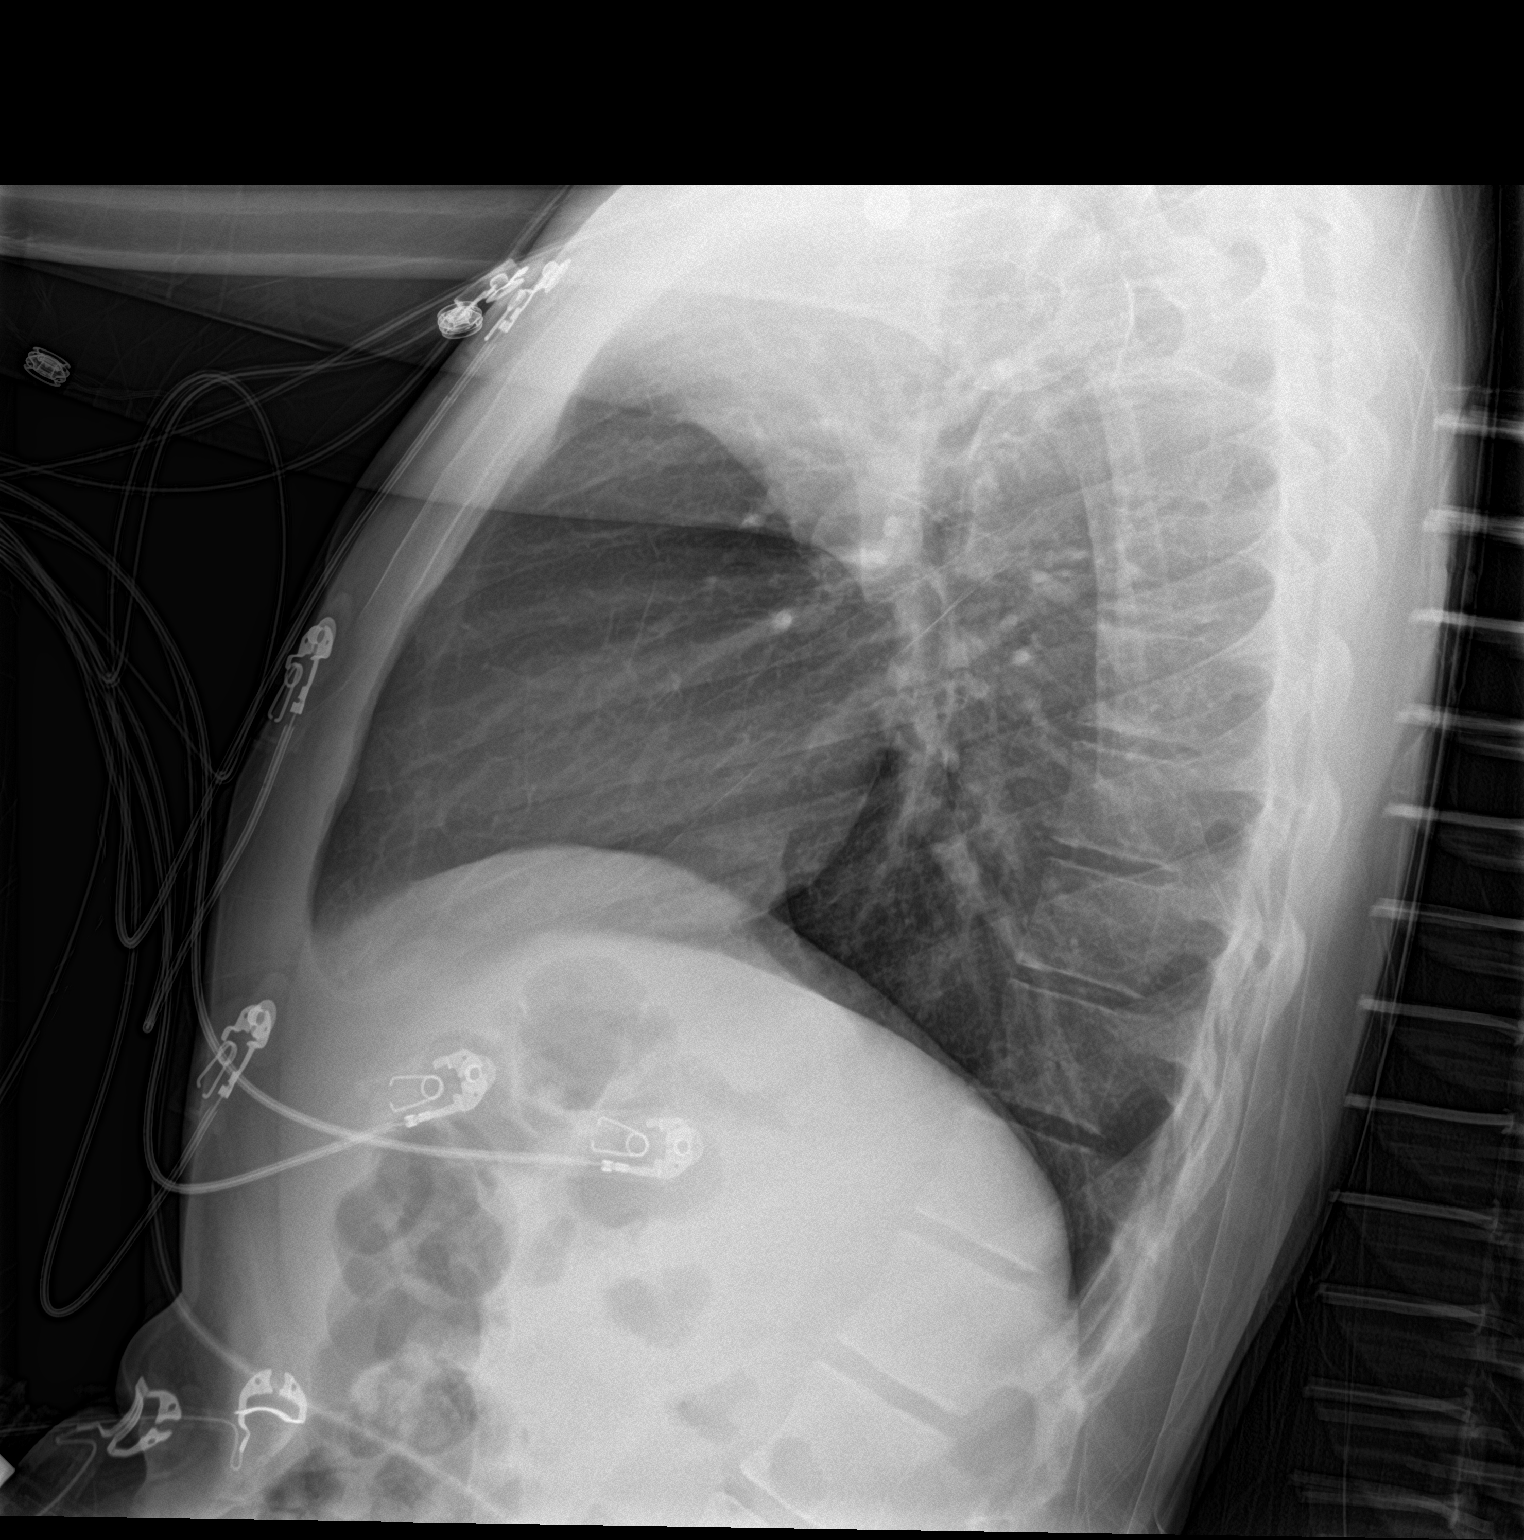

[chest ap]
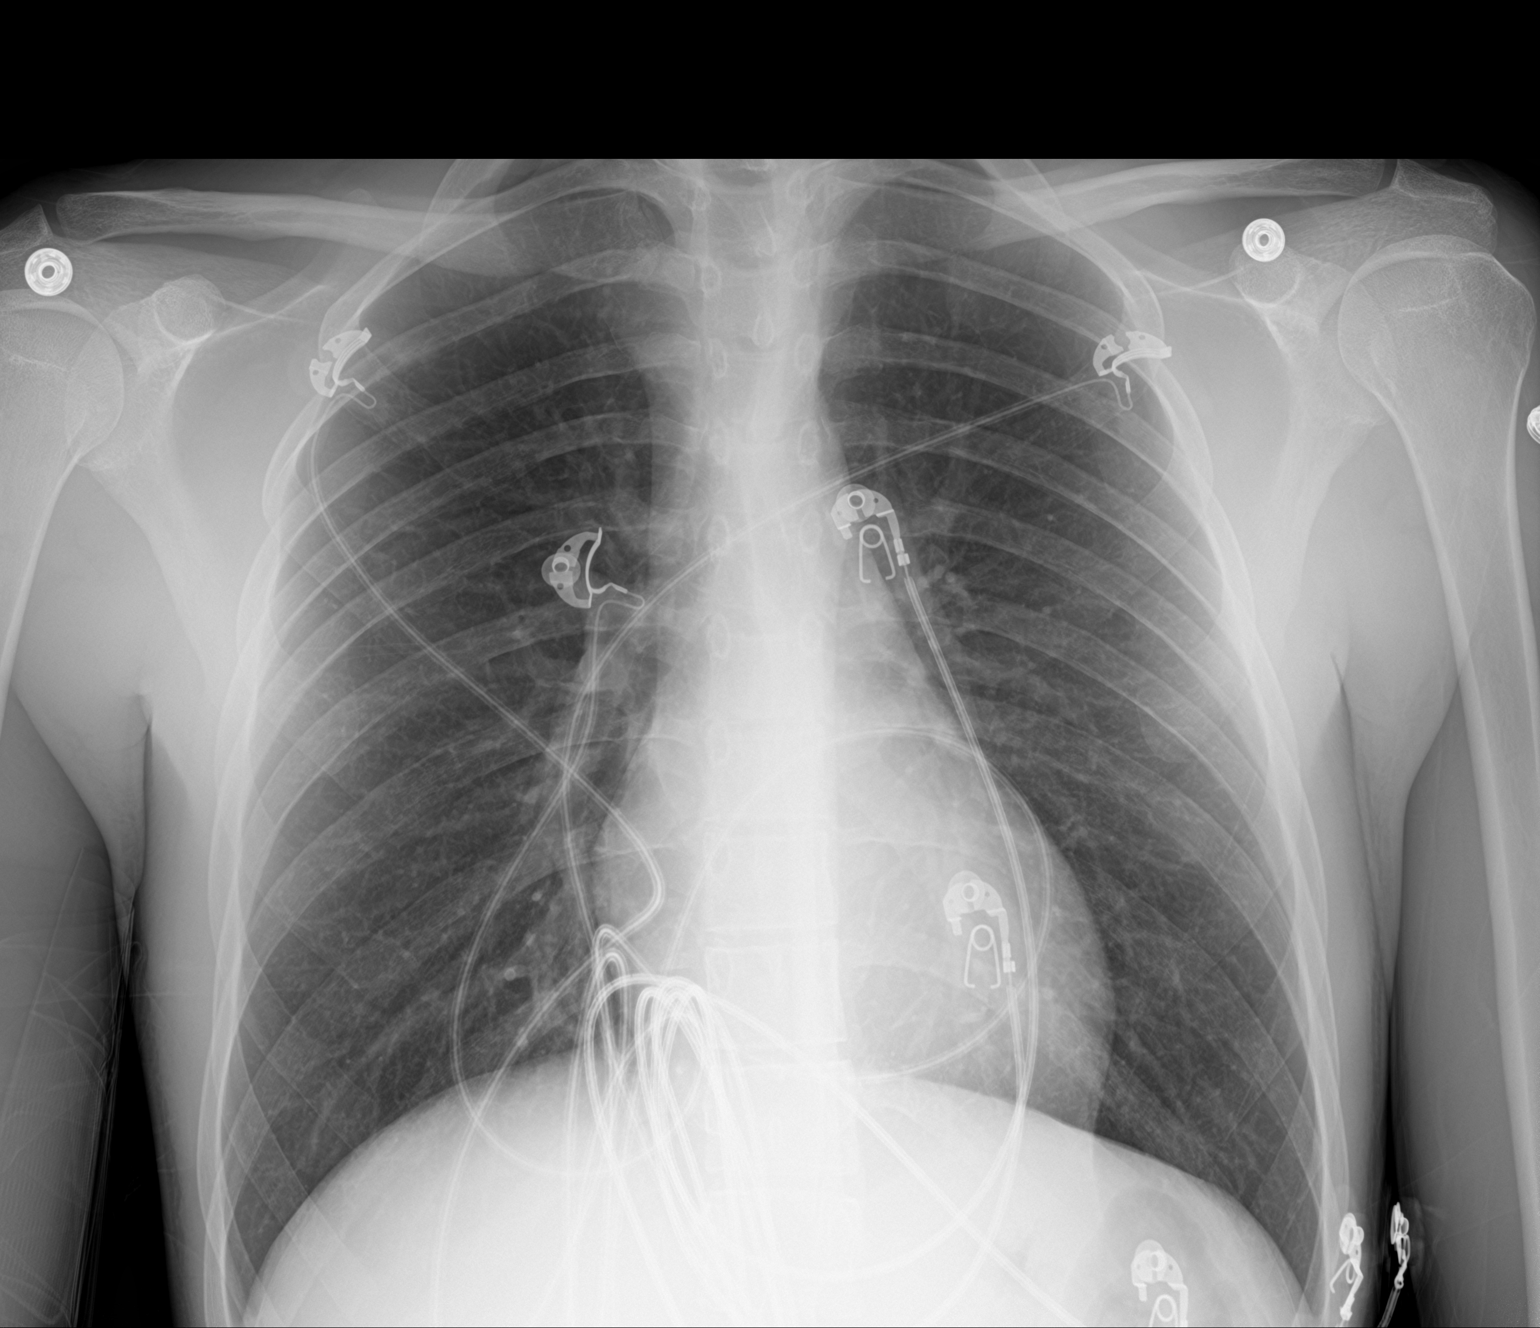

[2 of 2 positions shown; findings below may reference images not displayed]

FINDINGS: The heart size and mediastinal contours are within normal limits.
Both lungs are clear. The visualized skeletal structures are
unremarkable.
IMPRESSION: Normal examination.

## 2023-02-07 ENCOUNTER — Inpatient Hospital Stay
Admit: 2023-02-07 | Discharge: 2023-02-07 | Disposition: A | Discharge status: Home / Self Care | Payer: Medicaid (Managed Care) | Attending: Emergency Medicine

## 2023-02-07 DIAGNOSIS — R110 Nausea: Secondary | ICD-10-CM

## 2023-02-07 DIAGNOSIS — Z599 Problem related to housing and economic circumstances, unspecified: Secondary | ICD-10-CM

## 2023-02-07 DIAGNOSIS — Z5900 Homelessness unspecified: Secondary | ICD-10-CM

## 2023-02-07 LAB — URINALYSIS WITH REFLEX TO CULTURE
BACTERIA, URINE: NEGATIVE /hpf
Bilirubin, Urine: NEGATIVE
Blood, Urine: NEGATIVE
Glucose, Ur: NEGATIVE mg/dL
Ketones, Urine: NEGATIVE mg/dL
Leukocyte Esterase, Urine: NEGATIVE
Nitrite, Urine: NEGATIVE
Protein, UA: NEGATIVE mg/dL
Specific Gravity, UA: <1.005
Urobilinogen, Urine: 1 EU/dL (ref 0.2–1.0)
pH, Urine: 8 (ref 5.0–8.0)

## 2023-02-07 LAB — URINE DRUG SCREEN
Amphetamine, Urine: NEGATIVE
Barbiturates, Urine: NEGATIVE
Benzodiazepines, Urine: NEGATIVE
Cocaine, Urine: NEGATIVE
Methadone, Urine: NEGATIVE
Opiates, Urine: NEGATIVE
Phencyclidine, Urine: NEGATIVE
THC, TH-Cannabinol, Urine: NEGATIVE

## 2023-02-07 MED ORDER — ONDANSETRON 4 MG PO TBDP
4 MG | ORAL_TABLET | Freq: Three times a day (TID) | ORAL | 0 refills | Status: AC | PRN
Start: 2023-02-07 — End: ?

## 2023-02-07 MED ORDER — BUTALBITAL-APAP-CAFFEINE 50-325-40 MG PO TABS
50-325-40 MG | ORAL_TABLET | ORAL | 0 refills | Status: AC | PRN
Start: 2023-02-07 — End: ?

## 2023-02-07 MED ORDER — BUTALBITAL-APAP-CAFFEINE 50-325-40 MG PO TABS
50-325-40 | ORAL | Status: AC
Start: 2023-02-07 — End: 2023-02-07
  Administered 2023-02-07: 15:00:00 2 via ORAL

## 2023-02-07 MED ORDER — ONDANSETRON 4 MG PO TBDP
4 | ORAL | Status: AC
Start: 2023-02-07 — End: 2023-02-07
  Administered 2023-02-07: 15:00:00 8 mg via ORAL

## 2023-02-07 MED FILL — ONDANSETRON 4 MG PO TBDP: 4 MG | ORAL | Qty: 2

## 2023-02-07 MED FILL — BUTALBITAL-APAP-CAFFEINE 50-325-40 MG PO TABS: 50-325-40 MG | ORAL | Qty: 2

## 2023-02-07 NOTE — ED Notes (Signed)
Pt presents to via EMS complaining of right sided intermittent headache and abdominal pain. Pt states that headache feels like "someone stabbing me in the head with a hot sword". Pt also reports fatigue and feeling like "passing out" intermittently. Pt also endorses abdominal pain and nausea. Pt denies vomiting or pain during urination. Pt has HX of PTSD, bipolar, ADHD, and schizophrenia.  Pt is alert and oriented x 4, RR even and unlabored, skin is warm and dry. Assessment completed and pt updated on plan of care.  Call bell in reach.        Emergency Department Nursing Plan of Care       The Nursing Plan of Care is developed from the Nursing assessment and Emergency Department Attending provider initial evaluation.  The plan of care may be reviewed in the "ED Provider note".    The Plan of Care was developed with the following considerations:   Patient / Family readiness to learn indicated [id number] understanding  Persons(s) to be included in education: patient  Barriers to Learning/Limitations:None    Signed

## 2023-02-07 NOTE — ED Triage Notes (Addendum)
Pt presents via EMS for headache and nausea with no V/D. Pt endorses homelessness and has a hx of bipolar schizophrenia and PTSD. EMS also state pt has felt like he is going to pass out, and denies blurred vision and extremity weakness.

## 2023-02-07 NOTE — ED Notes (Signed)
Discharge instructions given to patient by  RN. Pt has been given counseling regarding at home treatment plan. Pt verbalizes understanding of need to seek further treatment if symptoms worsen. Pt ambulated off of unit in no signs of distress.

## 2023-02-07 NOTE — ED Triage Notes (Signed)
Pt presents to ED via EMS for SOB  Per EMS, this is pt's 3rd call to EMS today  Pt reports hx asthma and bronchitis, states he lost his inhaler  Pt speaking in complete sentences, NAD  Pt has no other complaints, no mental health concern addressed with this RN

## 2023-02-07 NOTE — ED Provider Notes (Signed)
[facility] EMERGENCY DEPT  EMERGENCY DEPARTMENT ENCOUNTER       Pt Name: Roger Rush  MRN: [id number]  Birthdate Jul 16, 2001  Date of evaluation: 02/07/2023  Provider: Mertha Finders, MD   PCP: No primary care provider on file.  Note Started: 5:01 PM 02/07/23     (Please note that parts of this dictation were completed with voice recognition software. Quite often unanticipated grammatical, syntax, homophones, and other interpretive errors are inadvertently transcribed by the computer software. Please disregards these errors. Please excuse any errors that have escaped final proofreading.)    CHIEF COMPLAINT       Chief Complaint   Patient presents with    Headache    Abdominal Pain        HISTORY OF PRESENT ILLNESS: 1 or more elements      History From: patient, History limited by:  none     Roger Rush is a 22 y.o. male with a history of several underlying psychiatric diagnoses (for which she denies having been on meds in the last year) who presents via EMS with chief complaint of having spells of nodding off on the bus this morning.  Also complaining of a headache since 9 AM.  Reports associated nausea.  Headache is right-sided in nature and he last had a similar headache a month ago.  States he has had these headaches in the past but has never been diagnosed with migraines.  Patient is concerned because he was falling asleep on the bus.  He has currently traveling from [place].  Denies alcohol or drug use.  Denies any recent cessation of psych meds.  Patient states he started to feel slightly better now.  He was seen at [facility] ER this morning for the same thing and he states he was given no meds and had no testing.  He has not taken anything over-the-counter for his headache and has never been on prescription headache meds.     Nursing Notes were all reviewed and agreed with or any disagreements were addressed in the HPI.     REVIEW OF SYSTEMS        Positives and Pertinent negatives as per HPI.    PAST HISTORY     Past  Medical History:  Past Medical History:   Diagnosis Date    ADHD     Asthma     Bipolar 1 disorder (HCC)     Schizo affective schizophrenia (HCC)        Past Surgical History:  History reviewed. No pertinent surgical history.    Family History:  History reviewed. No pertinent family history.    Social History:  Social History     Tobacco Use    Smoking status: Some Days     Types: Cigarettes   Substance Use Topics    Alcohol use: Yes     Comment: occ    Drug use: Not Currently       Allergies:  No Known Allergies    CURRENT MEDICATIONS      Discharge Medication List as of 02/07/2023 12:32 PM          SCREENINGS               No data recorded         PHYSICAL EXAM      ED Triage Vitals [02/07/23 1050]   Enc Vitals Group      BP 119/66      Pulse 62      Respirations  20      Temp 98.3 F (36.8 C)      Temp Source Oral      SpO2 100 %      Weight - Scale 76 kg (167 lb 9.6 oz)      Height 1.702 m (5\' 7" )      Head Circumference       Peak Flow       Pain Score       Pain Loc       Pain Edu?       Excl. in GC?        Physical Exam  Constitutional:       General: He is not in acute distress.     Appearance: He is not ill-appearing, toxic-appearing or diaphoretic.   Skin:     General: Skin is warm and dry.   Neurological:      General: No focal deficit present.          DIAGNOSTIC RESULTS   LABS:    Recent Results (from the past 24 hour(s))   Urinalysis with Reflex to Culture    Collection Time: 02/07/23 11:12 AM    Specimen: Urine   Result Value Ref Range    Color, UA YELLOW/STRAW      Appearance CLEAR CLEAR      Specific Gravity, UA <1.005     pH, Urine 8.0 5.0 - 8.0      Protein, UA Negative NEG mg/dL    Glucose, Ur Negative NEG mg/dL    Ketones, Urine Negative NEG mg/dL    Bilirubin, Urine Negative NEG      Blood, Urine Negative NEG      Urobilinogen, Urine 1.0 0.2 - 1.0 EU/dL    Nitrite, Urine Negative NEG      Leukocyte Esterase, Urine Negative NEG      WBC, UA 0-4 0 - 4 /hpf    RBC, UA 0-5 0 - 5 /hpf    Epithelial  Cells, UA FEW FEW /lpf    BACTERIA, URINE Negative NEG /hpf    Urine Culture if Indicated CULTURE NOT INDICATED BY UA RESULT CNI     Urine Drug Screen    Collection Time: 02/07/23 11:12 AM   Result Value Ref Range    Amphetamine, Urine Negative NEG      Barbiturates, Urine Negative NEG      Benzodiazepines, Urine Negative NEG      Cocaine, Urine Negative NEG      Methadone, Urine Negative NEG      Opiates, Urine Negative NEG      Phencyclidine, Urine Negative NEG      THC, TH-Cannabinol, Urine Negative NEG      Comments: (NOTE)        EKG: If performed, independent interpretation documented below in the ED course or MDM section     RADIOLOGY:  Non-plain film images such as CT, Ultrasound and MRI are read by the radiologist. Plain radiographic images are visualized and preliminarily interpreted by the ED Provider with the findings documented in the MDM section.     Interpretation per the Radiologist below, if available at the time of this note:     No orders to display          PROCEDURES   Unless otherwise noted below, none  Procedures       EMERGENCY DEPARTMENT COURSE and DIFFERENTIAL DIAGNOSIS/MDM   Vitals:    Vitals:    02/07/23 1050   BP:  119/66   Pulse: 62   Resp: 20   Temp: 98.3 F (36.8 C)   TempSrc: Oral   SpO2: 100%   Weight: 76 kg (167 lb 9.6 oz)   Height: 1.702 m (5\' 7" )        Patient was given the following medications:  Medications   ondansetron (ZOFRAN-ODT) disintegrating tablet 8 mg (8 mg Oral Given 02/07/23 1112)   butalbital-acetaminophen-caffeine (FIORICET, ESGIC) per tablet 2 tablet (2 tablets Oral Given 02/07/23 1112)       Medical Decision Making  When I expressed to patient that I was not overly alarmed that he fell asleep on the bus this morning he states that this has happened before to him in restaurants and other places over the last year.  Encouraged him to follow-up with PCP for further workup.  He is neurologically nonfocal.  No history of head trauma and has had these headaches before.   Will manage as possible migraine with Fioricet and Zofran.  Will check urine drug screen    No headache red flags. Neurologic exam without evidence of meningismus, focal neurologic findings. Presentation not consistent with acute intracranial bleed to include SAH (lack of risk factors, headache history). Presentation not consistent with acute CNS infection to include meningitis or brain abscess, Temporal arteritis unlikely, as is acute angle closure glaucoma given history and physical findings. Presentation not consistent with other acute, emergent causes of headache at this time. Plan to treat symptomatically with pain medication. No indication for imaging/LP at this time.      Amount and/or Complexity of Data Reviewed  Labs: ordered.    Risk  Prescription drug management.        **PLEASE SEE ED COURSE BELOW FOR FURTHER MDM DETAILS:    ED Course as of 02/07/23 1701   Fri Feb 07, 2023   1230 Counseled patient that he can get Tylenol and NSAIDs at the dollar store.  Will also DC with Zofran and Fioricet. [SS]      ED Course User Index  [SS] Mertha Finders, MD         FINAL IMPRESSION     1. Nausea    2. Nonintractable headache, unspecified chronicity pattern, unspecified headache type          DISPOSITION/PLAN   Cassel Jarecki  results have been reviewed with him.  He has been counseled regarding his diagnosis, treatment, and plan.  He verbally conveys understanding and agreement of the signs, symptoms, diagnosis, treatment and prognosis and additionally agrees to follow up as discussed.  He also agrees with the care-plan and conveys that all of his questions have been answered.  I have also provided discharge instructions for him that include: educational information regarding their diagnosis and treatment, and list of reasons why they would want to return to the ED prior to their follow-up appointment, should his condition change.        PATIENT REFERRED TO:  [facility]  [address]  [address]  [address]  [place] [address]  [phone]  Schedule an appointment as soon as possible for a visit          DISCHARGE MEDICATIONS:     Medication List        START taking these medications      butalbital-acetaminophen-caffeine 50-325-40 MG per tablet  Commonly known as: FIORICET, ESGIC  Take 1 tablet by mouth every 4 hours as needed for Headaches     ondansetron 4  MG disintegrating tablet  Commonly known as: ZOFRAN-ODT  Take 1 tablet by mouth 3 times daily as needed for Nausea or Vomiting               Where to Get Your Medications        Information about where to get these medications is not yet available    Ask your nurse or doctor about these medications  butalbital-acetaminophen-caffeine 50-325-40 MG per tablet  ondansetron 4 MG disintegrating tablet           DISCONTINUED MEDICATIONS:  Discharge Medication List as of 02/07/2023 12:32 PM          I am the Primary Clinician of Record.   Mertha Finders, MD (electronically signed)              Mertha Finders, MD  02/07/23 [phone]

## 2023-02-07 NOTE — ED Notes (Signed)
Discharge instructions were given to the patient by Christina RN. The patient left the Emergency Department ambulatory, alert and oriented and in no acute distress with 2 prescriptions. The patient was encouraged to call or return to the ED for worsening issues or problems and was encouraged to schedule a follow up appointment for continuing care. The patient verbalized understanding of discharge instructions and prescriptions, all questions were answered. The patient has no further concerns at this time.

## 2023-02-07 NOTE — ED Provider Notes (Signed)
[facility] EMERGENCY DEPT  EMERGENCY DEPARTMENT ENCOUNTER         Pt Name: Roger Rush  MRN: [id number]  Birthdate 2001/06/10  Date of evaluation: 02/07/2023  Provider: Birder Robson, MD   PCP: No primary care provider on file.  Note Started: 10:57 PM 02/07/23     CHIEF COMPLAINT       Chief Complaint   Patient presents with    Shortness of Breath     Hx asthma and bronchitis         HISTORY OF PRESENT ILLNESS: 1 or more elements      History From: Patient  HPI Limitations: None     Roger Rush is a 22 y.o. male who presents with sob which he developed at the bus station after missing his bus to [place] where he is from. He has a history of asthma. By the time he got here he says the sob has improved.  He is currently stuck and homeless in [place] because he does not have a refund on the bus ticket. He was seen in this ed earlier today, then several other visits to local Eds because "I dont know the area and have nowhere to go." He does not have money to purchase another ticket. While interviewing patient in the room his mother from [place] was on the phone. She says that she wants her son back home but does not have access to transportation nor enough money to buy him a ticket.    Patient denies alcohol, drug or tobacco use.    Please see more comprehensive history below under MDM  Nursing Notes were all reviewed in real time as they are made available. Any disagreements addressed in the HPI/MDM.     REVIEW OF SYSTEMS      Review of Systems   Constitutional:  Negative for fever.   Eyes:  Negative for visual disturbance.   Respiratory:  Positive for shortness of breath. Negative for cough.    Cardiovascular:  Negative for chest pain.   Gastrointestinal:  Negative for abdominal pain, nausea and vomiting.   Genitourinary:  Negative for dysuria, flank pain and hematuria.   Musculoskeletal:  Negative for arthralgias and back pain.   Skin:  Negative for color change.   Neurological:  Negative for syncope.   Psychiatric/Behavioral:   Negative for confusion and suicidal ideas.         Positives and Pertinent negatives as per HPI and MDM.    PAST HISTORY     Past Medical History:  Past Medical History:   Diagnosis Date    ADHD     Asthma     Bipolar 1 disorder (HCC)     Schizo affective schizophrenia (HCC)        Past Surgical History:  No past surgical history on file.    Family History:  No family history on file.    Social History:  Social History     Tobacco Use    Smoking status: Some Days     Types: Cigarettes   Substance Use Topics    Alcohol use: Yes     Comment: occ    Drug use: Not Currently       Allergies:  No Known Allergies      SOCIAL DETERMINANTS OF HEALTH:  Social Determinants of Health     Tobacco Use: High Risk (02/07/2023)    Patient History     Smoking Tobacco Use: Some Days     Smokeless  Tobacco Use: Unknown     Passive Exposure: Not on file   Alcohol Use: Not on file   Financial Resource Strain: Not on file   Food Insecurity: Not on file   Transportation Needs: Not on file   Physical Activity: Not on file   Stress: Not on file   Social Connections: Not on file   Intimate Partner Violence: Not on file   Depression: Not on file   Housing Stability: Not on file   Interpersonal Safety: Not At Risk (02/07/2023)    Interpersonal Safety Domain Source: IP Abuse Screening     Physical abuse: Denies     Verbal abuse: Denies     Emotional abuse: Denies     Financial abuse: Denies     Sexual abuse: Denies   Utilities: Not on file       CURRENT MEDICATIONS      Previous Medications    BUTALBITAL-ACETAMINOPHEN-CAFFEINE (FIORICET, ESGIC) 50-325-40 MG PER TABLET    Take 1 tablet by mouth every 4 hours as needed for Headaches    ONDANSETRON (ZOFRAN-ODT) 4 MG DISINTEGRATING TABLET    Take 1 tablet by mouth 3 times daily as needed for Nausea or Vomiting         PHYSICAL EXAM      ED Triage Vitals [02/07/23 2008]   Enc Vitals Group      BP 118/74      Pulse 64      Respirations 18      Temp 97.6 F (36.4 C)      Temp Source Oral      SpO2 100 %       Weight - Scale 68.3 kg (150 lb 8 oz)      Height 1.702 m (5\' 7" )      Head Circumference       Peak Flow       Pain Score       Pain Loc       Pain Edu?       Excl. in GC?        Physical Exam  Vitals and nursing note reviewed.   Constitutional:       General: He is not in acute distress.     Appearance: He is not ill-appearing, toxic-appearing or diaphoretic.   HENT:      Head: Atraumatic.   Eyes:      Extraocular Movements: Extraocular movements intact.      Conjunctiva/sclera: Conjunctivae normal.   Cardiovascular:      Rate and Rhythm: Normal rate and regular rhythm.      Pulses: Normal pulses.      Heart sounds: Normal heart sounds.   Pulmonary:      Effort: Pulmonary effort is normal. No tachypnea or accessory muscle usage.      Breath sounds: Normal breath sounds. No wheezing.   Chest:      Chest wall: No tenderness.   Abdominal:      General: Bowel sounds are normal. There is no distension.      Palpations: Abdomen is soft.      Tenderness: There is no abdominal tenderness.   Musculoskeletal:         General: Normal range of motion.      Cervical back: Normal range of motion and neck supple.   Skin:     General: Skin is warm and dry.      Capillary Refill: Capillary refill takes less than 2 seconds.   Neurological:  General: No focal deficit present.      Mental Status: He is alert and oriented to person, place, and time. Mental status is at baseline.            DIAGNOSTIC RESULTS     LABS:      Recent Results (from the past 24 hour(s))   Urinalysis with Reflex to Culture    Collection Time: 02/07/23 11:12 AM    Specimen: Urine   Result Value Ref Range    Color, UA YELLOW/STRAW      Appearance CLEAR CLEAR      Specific Gravity, UA <1.005     pH, Urine 8.0 5.0 - 8.0      Protein, UA Negative NEG mg/dL    Glucose, Ur Negative NEG mg/dL    Ketones, Urine Negative NEG mg/dL    Bilirubin, Urine Negative NEG      Blood, Urine Negative NEG      Urobilinogen, Urine 1.0 0.2 - 1.0 EU/dL    Nitrite, Urine  Negative NEG      Leukocyte Esterase, Urine Negative NEG      WBC, UA 0-4 0 - 4 /hpf    RBC, UA 0-5 0 - 5 /hpf    Epithelial Cells, UA FEW FEW /lpf    BACTERIA, URINE Negative NEG /hpf    Urine Culture if Indicated CULTURE NOT INDICATED BY UA RESULT CNI     Urine Drug Screen    Collection Time: 02/07/23 11:12 AM   Result Value Ref Range    Amphetamine, Urine Negative NEG      Barbiturates, Urine Negative NEG      Benzodiazepines, Urine Negative NEG      Cocaine, Urine Negative NEG      Methadone, Urine Negative NEG      Opiates, Urine Negative NEG      Phencyclidine, Urine Negative NEG      THC, TH-Cannabinol, Urine Negative NEG      Comments: (NOTE)           RADIOLOGY:    Interpretation per the Radiologist below, if available at the time of this note:     No orders to display        ED PHYSICIAN INTERPRETATION OF ECGs    ECG interpretation by ED physician in the absence of a cardiologist:       ED PHYSICIAN INTERPRETATION OF IMAGING STUDIES   Non-plain film images such as CT, Ultrasound and MRI are read by the radiologist.   Plain radiographic images and select CT studies are visualized and interpreted by me personally with the below findings:         RISK ASSESSMENT / SCREENINGS   Unless otherwise documented, all screenings conducted, scored and interpreted by me. Birder Robson, MD        ED PROCEDURES   Unless otherwise documented all procedures performed by me. Birder Robson, MD       CRITICAL CARE TIME              MEDICAL DECISION MAKING, EMERGENCY DEPARTMENT COURSE    I am the first and primary ED physician for this patient's ED visit today.    I reviewed our EMR for any past records that may contribute to the patient's current condition, including their past medical, surgical, social and family history. This also includes their most recent ED visits, previous hospitalizations and prior diagnostic data. I have reviewed and summarized the most pertinent findings in my HPI  and MDM.      Vitals:    Vitals:     02/07/23 2008   BP: 118/74   Pulse: 64   Resp: 18   Temp: 97.6 F (36.4 C)   TempSrc: Oral   SpO2: 100%   Weight: 68.3 kg (150 lb 8 oz)   Height: 1.702 m (5\' 7" )           Cardiac and pulse ox monitor interpreted by me.  The cardiac monitor revealed normal sinus rhythm. Pulse ox with good pleth showed oxygenation on RA over 92% with good pleth.   The cardiac and pulse ox monitor was ordered to monitor patient for signs of cardiac dysrhythmia or acute hypoxemia due to concern for possible dysrhythmia and/or hypoxemia based on patient's presentation, risk factors, underlying chronic comorbid conditions and/or metabolic abnormalities.     Independent history obtained from mother over the phone and as documented in hpi.     If applicable, social determinants affecting Dx or Tx were addressed by: care management consult to see if patient can be provided with financial support for the ticket; shelter resources provided    Records Reviewed (source and summary of external notes): nursing notes, available past medical records,       CC/HPI Summary, DDx, ED Course, and Reassessment:     Patient presents emergency department stating that he was short of breath at the [place] station.  He is currently speaking to me in full sentences, nonlabored respirations, his pulmonary exam is completely normal, oxygen saturation is 100% he does not appear to be in any respiratory distress.  He says that shortness of breath has resolved.  On further history taking he says that he missed his bus to [place] where he is from.  He is unable to purchase another ticket and so has been stuck in [place].  His mom who is awaiting him in [place] similarly is not able to buy him a ticket due to financial constraints and does not have access to any transportation.  He has no other medical complaints, no recent illness, denies any SI HI.  Rest of physical exam is overall benign.  There is no indication for any medical workup.  As spoke  with his mother myself and confirmed that she is unable to assist the patient in getting to [place] but will take him and if he was able to get there.  I placed a care management consult to reach out to patient for financial assistance and housing assistance.  No other workup indicated here in the emergency department.        CONSULTS: (Who and What was discussed)  IP CONSULT TO CASE MANAGEMENT  - financial and housing assistance    Patient was given the following medications:  Medications - No data to display    ED Orders Placed:  Orders Placed This Encounter   Procedures    Inpatient consult to Case Management       Disposition Considerations (Tests not done, Shared Decision Making, Pt Expectation of Test or Tx.):      I have discussed with the patient and/or caregiver my initial clinical impression which is based on an evidence-based clinical evaluation of the patient and interpretation of available results. Involved patient and/or caregiver in management, treatment options and final disposition. Patient/caregiver verbalize understanding of and agreement. We agree that at this time additional imaging or blood work is not needed.        Amount and/or Complexity  of Data Reviewed  HIGH complexity decision making performed   Presentation: ACUTE and SEVERE (giving consideration to thing such as systemic symptoms, impact on quality of life, morbidity and mortality).    Review and summarize past medical records: yes      Risks  OTC drugs.  Prescription drug management.  Parenteral controlled substances.  Drug therapy requiring intensive monitoring for toxicity.  Decision regarding hospitalization.   Social determinants of health, if present addressed per documentation above under mdm    FINAL IMPRESSION     1. Homeless    2. Financial difficulties          DISPOSITION/PLAN         DISPOSITION: DISCHARGE  The patient's results have been reviewed with patient and available family and/or caregiver. They verbally  convey their understanding and agreement of the patient's signs, symptoms, diagnosis, treatment and prognosis and additionally agree to follow up as recommended in the discharge instructions or to return to the Emergency Department should the patient's condition change prior to their follow-up appointment.   The patient and available family and/or caregiver verbally agree with the care plan and all of their questions have been answered. The discharge instructions have also been provided to the them with educational information regarding the patient's diagnosis as well a list of reasons why the patient would want to return to the ER prior to their follow-up appointment should any concerns arise, the patient's condition change or symptoms worsen.    Lenord Carbo, MD, Msc    PLAN:     Medication List        ASK your doctor about these medications      butalbital-acetaminophen-caffeine 50-325-40 MG per tablet  Commonly known as: FIORICET, ESGIC  Take 1 tablet by mouth every 4 hours as needed for Headaches     ondansetron 4 MG disintegrating tablet  Commonly known as: ZOFRAN-ODT  Take 1 tablet by mouth 3 times daily as needed for Nausea or Vomiting            2.   [facility] EMERGENCY DEPT  [address] [place] [address]  [phone]    As needed    3.   Return to ED if worse       I am the Primary Clinician of Record.   Birder Robson, MD (electronically signed)    (Please note that parts of this dictation were completed with voice recognition software. Quite often unanticipated grammatical, syntax, homophones, and other interpretive errors are inadvertently transcribed by the computer software. Please disregards these errors. Please excuse any errors that have escaped final proofreading.)           Birder Robson, MD  02/07/23 2257

## 2023-02-08 ENCOUNTER — Inpatient Hospital Stay
Admit: 2023-02-08 | Discharge: 2023-02-08 | Disposition: A | Discharge status: Home / Self Care | Payer: Medicaid (Managed Care) | Attending: Emergency Medicine

## 2023-02-10 ENCOUNTER — Emergency Department: Admit: 2023-02-11 | Payer: Medicaid (Managed Care)

## 2023-02-10 DIAGNOSIS — M25572 Pain in left ankle and joints of left foot: Secondary | ICD-10-CM

## 2023-02-10 NOTE — ED Provider Notes (Signed)
[facility] EM[facility]  EMERGENCY DEPARTMENT ENCOUNTER         Pt Name: Roger Rush  MRN: [id number]  Birthdate [birth year]  Date of evaluation: 02/10/2023  Provider: Elesa Hacker, PA-C   PCP: No primary care provider on file.  Note Started: 11:42 PM EDT 02/10/23     CHIEF COMPLAINT       Chief Complaint   Patient presents with    Leg Pain     Patient BIBEMS for c/o L leg pain acute onset 2200. Denies any injury.        HISTORY OF PRESENT ILLNESS: 1 or more elements      History From: Patient and patient's cousin  HPI Limitations: None     Roger Rush is a 22 y.o. male who presents with ankle pain acute onset today.  Patient is currently homeless.     Nursing Notes were all reviewed and agreed with or any disagreements were addressed in the HPI.  Please see MDM for additional details of HPI and ROS     REVIEW OF SYSTEMS      Review of Systems     Positives and Pertinent negatives as per HPI.    PAST HISTORY     Past Medical History:  Past Medical History:   Diagnosis Date    ADHD     Asthma     Bipolar 1 disorder (HCC)     Schizo affective schizophrenia (HCC)        Past Surgical History:  No past surgical history on file.    Family History:  No family history on file.    Social History:  Social History     Tobacco Use    Smoking status: Some Days     Types: Cigarettes   Substance Use Topics    Alcohol use: Yes     Comment: occ    Drug use: Not Currently       Allergies:  No Known Allergies    CURRENT MEDICATIONS      Previous Medications    BUTALBITAL-ACETAMINOPHEN-CAFFEINE (FIORICET, ESGIC) 50-325-40 MG PER TABLET    Take 1 tablet by mouth every 4 hours as needed for Headaches    ONDANSETRON (ZOFRAN-ODT) 4 MG DISINTEGRATING TABLET    Take 1 tablet by mouth 3 times daily as needed for Nausea or Vomiting       PHYSICAL EXAM      ED Triage Vitals   Enc Vitals Group      BP 02/10/23 2259 115/78      Pulse 02/10/23 2259 87      Respirations 02/10/23 2259 18      Temp 02/10/23 2259 97.7 F (36.5 C)      Temp src --       SpO2  02/10/23 2259 100 %      Weight - Scale 02/10/23 2258 68.5 kg (151 lb)      Height 02/10/23 2258 1.702 m (5\' 7" )      Head Circumference --       Peak Flow --       Pain Score --       Pain Loc --       Pain Edu? --       Excl. in GC? --         Physical Exam  Cardiovascular:      Rate and Rhythm: Normal rate and regular rhythm.   Pulmonary:      Effort: Pulmonary effort is normal.  Breath sounds: Normal breath sounds.   Musculoskeletal:         General: Tenderness present. No swelling.      Cervical back: Normal range of motion.   Feet:      Comments: Patient's feet are dirty.  No swelling seen.  Pedal pulses intact.  Capillary refill in the toes less than 2 seconds.  Patient has full range of motion elicited in the toes and ankle joint.  Tenderness to palpation of the ankle joint on the lateral side.   Neurological:      Mental Status: He is alert and oriented to person, place, and time. Mental status is at baseline.      GCS: GCS eye subscore is 4. GCS verbal subscore is 5. GCS motor subscore is 6.      Cranial Nerves: Cranial nerves 2-12 are intact.      Sensory: Sensation is intact.      Motor: Motor function is intact.      Coordination: Coordination is intact.      Gait: Gait is intact.      Deep Tendon Reflexes: Reflexes are normal and symmetric.      Comments: Patient initially unresponsive in a deep sleep, was breathing with pulses. when woken up he is alert and oriented to person place and date.  Pupils equal round reactive to light.  Extraocular movements intact.  Sensation intact.  Motor function intact.  Coordination intact.  Test of skew is negative.  Symmetrical facies, symmetrical shoulder raises.   Psychiatric:         Attention and Perception: Attention and perception normal.         Mood and Affect: Affect normal. Mood is depressed.         Speech: Speech normal.         Behavior: Behavior is cooperative.         Thought Content: Thought content normal.         Cognition and Memory: Cognition  and memory normal.         Judgment: Judgment normal.      Comments: Patient's recent and remote memory are intact.  He was able to tell me what he was doing today.  Denies any drug use.          DIAGNOSTIC RESULTS   LABS:     Recent Results (from the past 24 hour(s))   Urine Drug Screen    Collection Time: 02/11/23 12:30 AM   Result Value Ref Range    Amphetamine, Urine Negative NEG      Barbiturates, Urine Positive (A) NEG      Benzodiazepines, Urine Negative NEG      Cocaine, Urine Negative NEG      Methadone, Urine Negative NEG      Opiates, Urine Negative NEG      Phencyclidine, Urine Negative NEG      THC, TH-Cannabinol, Urine Negative NEG      Comments: (NOTE)        RADIOLOGY:  Non-plain film images such as CT, Ultrasound and MRI are read by the radiologist. Plain radiographic images are visualized and preliminarily interpreted by myself with the below findings:          Interpretation per the Radiologist below, if available at the time of this note:     XR ANKLE LEFT (MIN 3 VIEWS)    Result Date: 02/11/2023  INDICATION: pain with flexion extension Reason for exam:->pain with flexion extension EXAM:  ANKLE  3 VIEW COMPARISON: None FINDINGS: 3 views of the left ankle demonstrate no acute fracture or subluxation. There is no significant soft tissue swelling. The ankle mortise is intact.     No acute fracture. Electronically signed by Liz Malady      PROCEDURES   Unless otherwise noted below, none  Procedures       EMERGENCY DEPARTMENT COURSE and DIFFERENTIAL DIAGNOSIS/MDM   Vitals:    Vitals:    02/10/23 2258 02/10/23 2259   BP:  115/78   Pulse:  87   Resp:  18   Temp:  97.7 F (36.5 C)   SpO2:  100%   Weight: 68.5 kg (151 lb)    Height: 1.702 m (5\' 7" )         Patient was given the following medications:  Medications   naloxone (NARCAN) 2 MG/2ML injection (has no administration in time range)       CONSULTS: (Who and What was discussed)  None    Chronic Conditions: Asthma, bipolar 2, schizoaffective  schizophrenia    Social Determinants affecting Dx or Tx: None    Records Reviewed (source and summary of external records): Nursing Notes, Old Medical Records, Previous Radiology Studies, and Previous Laboratory Studies    MDM: CC/HPI Summary, DDx, ED Course, and Reassessment, Disposition Considerations (Tests not done, Shared Decision Making, Pt Expectation of Test or Tx.): Patient is a 22 year old male who presents emergency room today with his cousin.  Patient states that his left ankle is chronically in pain but as of late the pain has been worsening to the point that he cannot flex or extend it since he has been walking several miles a day on it.  He is currently homeless as he missed a bus several days ago back home to [place] and does not have the money to get back home.  Upon arrival in the ED patient was ambulatory and walked back to the room.  When I came to evaluate the patient he was in a deep sleep and was unable to be woken.  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  His cousin states that he often falls into deep sleeps when he is stressed.  He states that they have not slept in several days.  He denies any drug use and states that they have never used drugs in the past.  Patient has a history of bipolar 1 disorder as well as schizoaffective schizophrenia.  He has been in and out of emergency rooms over the past few days as they have been [place] of the bus station and are not able to sleep there.  He denies SI or HI at this time.    Differential diagnosis at this time includes possible drug use, ankle sprain versus strain versus dislocation versus fracture.  Patient will have urine drug screen at this time in addition to x-ray.    ED Course as of 02/11/23  0120   Tue Feb 11, 2023   0019 Patient sitting upright in bed, responsive.  Acting normally at this time.  He is providing a urine sample and is relieved that his ankle is not fractured at this time. [KG]      ED  Course User Index  [KG] Elesa Hacker, PA-C     X-rays of the ankle are negative at this time.  Patient is a brace responsive and eating food at this time.  Urine drug screen is reassuring, patient does test positive for barbiturates but he did take a Fioricet yesterday.  Patient will be discharged at this time.      FINAL IMPRESSION     1. Acute left ankle pain          DISPOSITION/PLAN   DISPOSITION          Care plan outlined and precautions discussed.  Patient has no new complaints, changes, or physical findings.  Results of x-ray were reviewed with the patient. All of pt's questions and concerns were addressed. Patient was instructed and agrees to follow up with PCP as given in paperwork, as well as to return to the ED upon further deterioration. Patient is ready to go home.      PATIENT REFERRED TO:  No follow-up provider specified.     DISCHARGE MEDICATIONS:     Medication List        ASK your doctor about these medications      butalbital-acetaminophen-caffeine 50-325-40 MG per tablet  Commonly known as: FIORICET, ESGIC  Take 1 tablet by mouth every 4 hours as needed for Headaches     ondansetron 4 MG disintegrating tablet  Commonly known as: ZOFRAN-ODT  Take 1 tablet by mouth 3 times daily as needed for Nausea or Vomiting                DISCONTINUED MEDICATIONS:  Current Discharge Medication List          I have seen and evaluated the patient autonomously. My supervision physician was on site and available for consultation if needed.     I am the Primary Clinician of Record.   Elesa Hacker, PA-C (electronically signed)    (Please note that parts of this dictation were completed with voice recognition software. Quite often unanticipated grammatical, syntax, homophones, and other interpretive errors  are inadvertently transcribed by the computer software. Please disregards these errors. Please excuse any errors that have escaped final proofreading.)     Elesa Hacker, PA-C  02/11/23 0121

## 2023-02-11 ENCOUNTER — Inpatient Hospital Stay
Admit: 2023-02-11 | Discharge: 2023-02-11 | Disposition: A | Discharge status: Home / Self Care | Payer: Medicaid (Managed Care)

## 2023-02-11 LAB — URINE DRUG SCREEN
Amphetamine, Urine: NEGATIVE
Barbiturates, Urine: POSITIVE — AB
Benzodiazepines, Urine: NEGATIVE
Cocaine, Urine: NEGATIVE
Methadone, Urine: NEGATIVE
Opiates, Urine: NEGATIVE
Phencyclidine, Urine: NEGATIVE
THC, TH-Cannabinol, Urine: NEGATIVE

## 2023-02-11 MED ORDER — NALOXONE HCL 2 MG/2ML IJ SOSY
2 | INTRAMUSCULAR | Status: DC
Start: 2023-02-11 — End: 2023-02-11

## 2023-02-11 MED FILL — NALOXONE HCL 2 MG/2ML IJ SOSY: 2 MG/ML | INTRAMUSCULAR | Qty: 2

## 2023-02-11 NOTE — ED Notes (Signed)
Patient discharged from the ED by Everardo Beals, MD. Diagnosis, medications, precautions and follow-ups were reviewed with the patient/family. Questions were asked and answered prior to departure. Patient departed the ED via walking and was accompanied by friend to waiting room to wait for a ride.

## 2023-02-12 ENCOUNTER — Inpatient Hospital Stay
Admit: 2023-02-12 | Discharge: 2023-02-12 | Disposition: A | Discharge status: Home / Self Care | Payer: Medicaid (Managed Care) | Attending: Emergency Medicine

## 2023-02-12 DIAGNOSIS — M25572 Pain in left ankle and joints of left foot: Secondary | ICD-10-CM

## 2023-02-12 DIAGNOSIS — G8929 Other chronic pain: Secondary | ICD-10-CM

## 2023-02-12 MED ORDER — IBUPROFEN 400 MG PO TABS
400 | ORAL | Status: AC
Start: 2023-02-12 — End: 2023-02-12
  Administered 2023-02-12: 06:00:00 800 mg via ORAL

## 2023-02-12 MED FILL — IBUPROFEN 400 MG PO TABS: 400 MG | ORAL | Qty: 2

## 2023-02-12 NOTE — Discharge Instructions (Signed)
Local Primary Care Physicians  [facility] [phone]  Jennifer Carr, MD  Micah Houghton, MD  Brent Logie, MD [facility] [phone]  Mary Booker, FNP  Motsumi Moja, MD  Alice Pyles, MD  Allen Tsui, MD   [facility] [phone]  Pamela Wratchford, MD  Timothy Wratchford, MD [facility] [phone]  Cheryl Jordan-Sayles, MD  William Moore, MD  William Noller, MD  Michael Sheehan, MD   [facility] [phone]  Hill Carter Jr, MD  Thomas Hubbard, MD  Brenda Sawyer, NP [facility] [phone]  Roger Capello, MD  Mark Janney, MD  Karen Kirby, MD  Richard Overmeyer, MD  Fred Payne, MD  Ken Roberts, MD  Robert Rolfes Jr, MD   [facility] [phone]  Mark Rosenberg, MD [facility] [phone]  John Cornett, MD  Elaine Ferrary, NP  Kenneth Heatwole, MD  John Keiper, MD  Ian Shupack, MD  Kevin Sahli, MD  Laura Burijon, MD   [facility] [phone]  Thomas Blake, MD  Genevieve Busch, FNP  Janet Horton, NP  James Jernigan, MD  Matthew Jones, MD  Jethro Piland, MD  Kenneth Simpson, MD [facility] [phone]  Ronald Artz, MD  Brian Foster, MD  Navleen Kaur, MD  David Kelly, MD  Jason Lee, MD   [facility] [phone]  Harold Green, MD  Torino Jennings, MD [facility] [phone]  John Kowalski, MD  Hamdy Sayed, MD  Charles Sparrow, MD   [facility] [phone]  Lee Blackburn, MD  Kim Walker, MD  Mark Petrizzi, MD  Michael Petrizzi, MD  Gregory Stephens, MD  Julie Campbell-Leonard, NP  Charlotte Woodfin, MD [facility]   [phone]  Michael Carlson, MD  Hayes Hanley, MD  Augustine Lewis, MD   [facility] [phone]  [facility]  Anup Gokli, MD  Laura Lavold, FNP  Allison McInnis, PA-C  Margaret Montcastle, FNP  Joseph Moore,  PA-C  Dennis Thomas, MD  Jennifer Varga, NP   Christina Wills, DO             Miscellaneous:  Ernest Fornaris, MD [phone]       County Health Departments     For adult and child immunizations, family planning, TB screening, STD testing and women's health services.   [place] [phone]      [place] [phone]    [place]   [phone]    [place]   [phone]    [place]   [phone]    [place]: [place] [phone]      [place] [phone]      [place] [phone]          General Health Clinics     For primary care services, woman and child wellness, and some clinics providing specialty care.   [facility] [address] [phone]   [facility] [address] [phone]   [facility] [address] [phone]   [facility] [address] [phone]   [facility] [address] [phone]   [facility] [address] [phone]   [facility] [address], [place] [phone]   [facility] [address] [phone], [phone]       [place] [address] [phone]     [place] [address] [phone]   [facility] [address] [phone]   [facility]  [address] [phone]   [facility] ([email or web address]/mission.[email or web address]) [phone]         Sexual Health/Woman Wellness Clinics    For STD/HIV testing and treatment, pregnancy testing and services, men's health, birth control services, LGBT services, and hepatitis/HPV vaccine services.   [facility] [address] [phone]   [facility] [address] [phone]   [facility] [address] [phone]   [facility] [address], 5th floor [phone]   [facility]  [place] [address] [phone]   [facility] [address] [phone]         Specialty Service Clinics     [facility] [phone]   [facility]   [phone]   [facility]   [phone]   [facility], [facility] Children's Services: [place] [phone]       [place] [phone]   [facility]   [phone]   [facility]   [phone]   [facility]     [phone]   [facility]   [phone]   [facility] [phone]

## 2023-02-12 NOTE — ED Notes (Signed)
Pt presents ambulatory to ED complaining of left leg pain. Pt reports he has chronic leg pain since he was 22 years old. Pt denies taking any medication PTA for pain. Pt states he is unable to bear weight on his leg today. Pt is alert and oriented x 4, RR even and unlabored, skin is warm and dry. Assesment completed and pt updated on plan of care.       Emergency Department Nursing Plan of Care       The Nursing Plan of Care is developed from the Nursing assessment and Emergency Department Attending provider initial evaluation.  The plan of care may be reviewed in the "ED Provider note".    The Plan of Care was developed with the following considerations:   Patient / Family readiness to learn indicated YQ:MVHQIONGEX understanding  Persons(s) to be included in education: patient  Barriers to Learning/Limitations:None    Signed     Kellyn Mccary T, RN    02/12/2023   1:53 AM

## 2023-02-12 NOTE — ED Provider Notes (Signed)
[facility] EMERGENCY DEPT  EMERGENCY DEPARTMENT ENCOUNTER         Pt Name: Roger Rush  MRN: [id number]  Birthdate 2000-11-29  Date of evaluation: 02/12/2023  Provider: Birder Robson, MD   PCP: No primary care provider on file.  Note Started: 1:46 AM 02/12/23     CHIEF COMPLAINT       Chief Complaint   Patient presents with    Leg Pain     Atraumatic left leg pain, chronic        HISTORY OF PRESENT ILLNESS: 1 or more elements      History From: Patient  HPI Limitations: None     Roger Rush is a 22 y.o. male who presents with chronic left ankle pain.  The patient is currently homeless.  Says that he has had this pain since he was about 22 years old.  No new injuries or falls.  He was in the emergency department yesterday for the same issue, had x-rays done that were negative.  He presents again today because the pain persist.      Please see more comprehensive history below under MDM  Nursing Notes were all reviewed in real time as they are made available. Any disagreements addressed in the HPI/MDM.     REVIEW OF SYSTEMS      Review of Systems   Constitutional:  Negative for chills and fever.   Eyes:  Negative for visual disturbance.   Respiratory:  Negative for cough and shortness of breath.    Cardiovascular:  Negative for chest pain.   Gastrointestinal:  Negative for abdominal pain, nausea and vomiting.   Genitourinary:  Negative for dysuria, flank pain and hematuria.   Musculoskeletal:  Positive for arthralgias. Negative for back pain and joint swelling.   Skin:  Negative for color change.   Neurological:  Negative for weakness and numbness.   Psychiatric/Behavioral:  Negative for confusion.         Positives and Pertinent negatives as per HPI and MDM.    PAST HISTORY     Past Medical History:  Past Medical History:   Diagnosis Date    ADHD     Asthma     Bipolar 1 disorder (HCC)     Schizo affective schizophrenia (HCC)        Past Surgical History:  No past surgical history on file.    Family History:  No family history on  file.    Social History:  Social History     Tobacco Use    Smoking status: Some Days     Types: Cigarettes   Substance Use Topics    Alcohol use: Yes     Comment: occ    Drug use: Not Currently       Allergies:  No Known Allergies      SOCIAL DETERMINANTS OF HEALTH:  Social Determinants of Health     Tobacco Use: High Risk (02/07/2023)    Patient History     Smoking Tobacco Use: Some Days     Smokeless Tobacco Use: Unknown     Passive Exposure: Not on file   Alcohol Use: Not At Risk (02/11/2023)    AUDIT-C     Frequency of Alcohol Consumption: Never     Average Number of Drinks: Patient does not drink     Frequency of Binge Drinking: Never   Financial Resource Strain: Not on file   Food Insecurity: Not on file   Transportation Needs: Not on file  Physical Activity: Not on file   Stress: Not on file   Social Connections: Not on file   Intimate Partner Violence: Not on file   Depression: Not on file   Housing Stability: Not on file   Interpersonal Safety: Not At Risk (02/07/2023)    Interpersonal Safety Domain Source: IP Abuse Screening     Physical abuse: Denies     Verbal abuse: Denies     Emotional abuse: Denies     Financial abuse: Denies     Sexual abuse: Denies   Utilities: Not on file       CURRENT MEDICATIONS      Previous Medications    BUTALBITAL-ACETAMINOPHEN-CAFFEINE (FIORICET, ESGIC) 50-325-40 MG PER TABLET    Take 1 tablet by mouth every 4 hours as needed for Headaches    ONDANSETRON (ZOFRAN-ODT) 4 MG DISINTEGRATING TABLET    Take 1 tablet by mouth 3 times daily as needed for Nausea or Vomiting         PHYSICAL EXAM      ED Triage Vitals [02/12/23 0028]   Enc Vitals Group      BP 107/72      Pulse 71      Respirations 17      Temp 98.4 F (36.9 C)      Temp Source Oral      SpO2 99 %      Weight - Scale 68 kg (150 lb)      Height 1.702 m (5\' 7" )      Head Circumference       Peak Flow       Pain Score       Pain Loc       Pain Edu?       Excl. in GC?        Physical Exam  Vitals and nursing note  reviewed.   Constitutional:       Appearance: He is not toxic-appearing.   HENT:      Head: Atraumatic.      Nose: Nose normal.      Mouth/Throat:      Mouth: Mucous membranes are moist.   Cardiovascular:      Rate and Rhythm: Normal rate and regular rhythm.      Pulses: Normal pulses.      Heart sounds: Normal heart sounds.   Pulmonary:      Effort: Pulmonary effort is normal.      Breath sounds: Normal breath sounds.   Abdominal:      Palpations: Abdomen is soft.      Tenderness: There is no abdominal tenderness.   Musculoskeletal:      Right knee: Normal.      Left knee: Normal.      Right lower leg: Normal. No edema.      Left lower leg: Normal. No edema.      Right ankle: Normal.      Right Achilles Tendon: Normal.      Left ankle: No swelling, deformity, ecchymosis or lacerations. Tenderness present. Normal pulse.      Left Achilles Tendon: Normal.      Right foot: Normal. Normal pulse.      Left foot: Normal. Normal pulse.   Neurological:      Mental Status: He is alert.            DIAGNOSTIC RESULTS     LABS:      No results found for this or any previous visit (from  the past 24 hour(s)).       RADIOLOGY:    Interpretation per the Radiologist below, if available at the time of this note:     No orders to display        ED PHYSICIAN INTERPRETATION OF ECGs    ECG interpretation by ED physician in the absence of a cardiologist:       ED PHYSICIAN INTERPRETATION OF IMAGING STUDIES   Non-plain film images such as CT, Ultrasound and MRI are read by the radiologist.   Plain radiographic images and select CT studies are visualized and interpreted by me personally with the below findings:         RISK ASSESSMENT / SCREENINGS   Unless otherwise documented, all screenings conducted, scored and interpreted by me. Birder Robson, MD        ED PROCEDURES   Unless otherwise documented all procedures performed by me. Birder Robson, MD       CRITICAL CARE TIME              MEDICAL DECISION MAKING, EMERGENCY DEPARTMENT COURSE     I am the first and primary ED physician for this patient's ED visit today.    I reviewed our EMR for any past records that may contribute to the patient's current condition, including their past medical, surgical, social and family history. This also includes their most recent ED visits, previous hospitalizations and prior diagnostic data. I have reviewed and summarized the most pertinent findings in my HPI and MDM.      Vitals:    Vitals:    02/12/23 0028   BP: 107/72   Pulse: 71   Resp: 17   Temp: 98.4 F (36.9 C)   TempSrc: Oral   SpO2: 99%   Weight: 68 kg (150 lb)   Height: 1.702 m (5\' 7" )       If applicable, social determinants affecting Dx or Tx were addressed by: Homeless-I saw this patient a few days ago here in the emergency department, consulted care management in regards to assistance with housing.  He is originally from [place], also asked if there is a possibility to help him financially to get a bus to get back home.  Care management evaluation and management of patient is documented in their note.  Today provided him with housing assistance resources.    Records Reviewed (source and summary of external notes): nursing notes, available past medical records,       CC/HPI Summary, DDx, ED Course, and Reassessment:     Patient is a 22 year old gentleman who is currently homeless in [place] and presents with chronic pain in his left ankle.  He says that he has had the pain since he was 22 years old and the pain started after he walked over 100 miles.  Ever since he has had discomfort in the ankle but this gets worse when he walks for longer periods of time or after prolonged standing.  Since he currently does not have housing he has been walking more than normal and the pain in the ankle has gotten exacerbated.  He was seen for this here yesterday, x-rays were normal.  Denies any other symptoms.  On exam there is no swelling, erythema, bruising, deformity or any abnormalities of the left ankle.   He does have diffuse tenderness to palpation, not localized to any specific ligaments.  There is no laxity in the ankle ligaments.  I have no concern for septic joint, gout  or pseudogout, fracture, severe ligament disruption.  Will provide Motrin for pain control.  Given that he has had x-rays yesterday there is indication to repeat those.  Based on exam and history, I considered labs but deferred as they would not change management or add to diagnosis.    Progress Note:   I have re-examined the patient. I engaged patient in shared decision making re disposition. Patient/caregiver reports feeling well and comfortable with plan to discharge followed by close PCP/Specialist follow-up. No other symptoms or concerns.           CONSULTS: (Who and What was discussed)  None      Patient was given the following medications:  Medications   ibuprofen (ADVIL;MOTRIN) tablet 800 mg (800 mg Oral Given 02/12/23 0135)       ED Orders Placed:  No orders of the defined types were placed in this encounter.      Disposition Considerations (Tests not done, Shared Decision Making, Pt Expectation of Test or Tx.):      I have discussed with the patient and/or caregiver my initial clinical impression which is based on an evidence-based clinical evaluation of the patient and interpretation of available results. Involved patient and/or caregiver in management, treatment options and final disposition. Patient/caregiver verbalize understanding of and agreement. We agree that at this time additional imaging or blood work is not needed.      Risks  OTC drugs.  Prescription drug management.  Parenteral controlled substances.  Drug therapy requiring intensive monitoring for toxicity.  Decision regarding hospitalization.   Social determinants of health, if present addressed per documentation above under mdm    FINAL IMPRESSION     1. Chronic pain of left ankle    2. Homeless          DISPOSITION/PLAN         DISPOSITION: DISCHARGE  The patient's  results have been reviewed with patient and available family and/or caregiver. They verbally convey their understanding and agreement of the patient's signs, symptoms, diagnosis, treatment and prognosis and additionally agree to follow up as recommended in the discharge instructions or to return to the Emergency Department should the patient's condition change prior to their follow-up appointment.   The patient and available family and/or caregiver verbally agree with the care plan and all of their questions have been answered. The discharge instructions have also been provided to the them with educational information regarding the patient's diagnosis as well a list of reasons why the patient would want to return to the ER prior to their follow-up appointment should any concerns arise, the patient's condition change or symptoms worsen.    Lenord Carbo, MD, Msc    PLAN:     Medication List        ASK your doctor about these medications      butalbital-acetaminophen-caffeine 50-325-40 MG per tablet  Commonly known as: FIORICET, ESGIC  Take 1 tablet by mouth every 4 hours as needed for Headaches     ondansetron 4 MG disintegrating tablet  Commonly known as: ZOFRAN-ODT  Take 1 tablet by mouth 3 times daily as needed for Nausea or Vomiting            2.   primary care clinic    Schedule an appointment as soon as possible for a visit       [facility] EMERGENCY DEPT  [address]  [place] [address]  [phone]  Go to   If symptoms worsen  3.   Return to ED if worse       I am the Primary Clinician of Record.   Birder Robson, MD (electronically signed)    (Please note that parts of this dictation were completed with voice recognition software. Quite often unanticipated grammatical, syntax, homophones, and other interpretive errors are inadvertently transcribed by the computer software. Please disregards these errors. Please excuse any errors that have escaped final proofreading.)           Birder Robson, MD  02/12/23  [phone]

## 2023-02-12 NOTE — ED Triage Notes (Signed)
[place] pain  Pt denies taking medication for pain  Pt able to transfer from wheelchair to seat in waiting area with no assistance.

## 2023-02-12 NOTE — ED Notes (Signed)
Discharge instructions given to patient by  RN. Pt has been given counseling regarding at home treatment plan. Pt verbalizes understanding of need to seek further treatment if symptoms worsen. Pt ambulated off of unit in no signs of distress.
